# Patient Record
Sex: Male | Born: 1960 | Race: White | Hispanic: No | Marital: Married | Smoking: Never smoker
Health system: Southern US, Community
[De-identification: ages and names within clinical notes are randomized; demographics above are authoritative.]

## PROBLEM LIST (undated history)

## (undated) DIAGNOSIS — E785 Hyperlipidemia, unspecified: Secondary | ICD-10-CM

## (undated) DIAGNOSIS — I10 Essential (primary) hypertension: Secondary | ICD-10-CM

## (undated) DIAGNOSIS — E669 Obesity, unspecified: Secondary | ICD-10-CM

## (undated) DIAGNOSIS — M1990 Unspecified osteoarthritis, unspecified site: Secondary | ICD-10-CM

## (undated) DIAGNOSIS — K219 Gastro-esophageal reflux disease without esophagitis: Secondary | ICD-10-CM

## (undated) HISTORY — PX: TONSILLECTOMY: SUR1361

## (undated) HISTORY — DX: Essential (primary) hypertension: I10

## (undated) HISTORY — DX: Hyperlipidemia, unspecified: E78.5

## (undated) HISTORY — PX: KNEE ARTHROSCOPY: SUR90

## (undated) HISTORY — DX: Obesity, unspecified: E66.9

## (undated) HISTORY — DX: Gastro-esophageal reflux disease without esophagitis: K21.9

---

## 1987-08-17 HISTORY — PX: RHINOPLASTY: SUR1284

## 2004-03-16 ENCOUNTER — Ambulatory Visit (HOSPITAL_COMMUNITY): Admission: RE | Admit: 2004-03-16 | Discharge: 2004-03-16 | Payer: Self-pay | Admitting: *Deleted

## 2004-09-15 ENCOUNTER — Ambulatory Visit: Payer: Self-pay | Admitting: Family Medicine

## 2004-09-22 ENCOUNTER — Ambulatory Visit: Payer: Self-pay | Admitting: Family Medicine

## 2004-10-29 ENCOUNTER — Ambulatory Visit: Payer: Self-pay | Admitting: Family Medicine

## 2005-05-07 ENCOUNTER — Ambulatory Visit: Payer: Self-pay | Admitting: Family Medicine

## 2005-05-19 ENCOUNTER — Ambulatory Visit: Payer: Self-pay | Admitting: Family Medicine

## 2006-02-10 ENCOUNTER — Ambulatory Visit: Payer: Self-pay | Admitting: Internal Medicine

## 2006-02-21 ENCOUNTER — Ambulatory Visit: Payer: Self-pay | Admitting: Family Medicine

## 2006-03-07 ENCOUNTER — Ambulatory Visit: Payer: Self-pay | Admitting: Family Medicine

## 2006-04-20 ENCOUNTER — Ambulatory Visit: Payer: Self-pay | Admitting: Family Medicine

## 2007-04-13 DIAGNOSIS — K219 Gastro-esophageal reflux disease without esophagitis: Secondary | ICD-10-CM | POA: Insufficient documentation

## 2007-04-13 DIAGNOSIS — E669 Obesity, unspecified: Secondary | ICD-10-CM

## 2007-04-13 DIAGNOSIS — E785 Hyperlipidemia, unspecified: Secondary | ICD-10-CM | POA: Insufficient documentation

## 2007-04-13 DIAGNOSIS — I10 Essential (primary) hypertension: Secondary | ICD-10-CM

## 2007-05-05 ENCOUNTER — Ambulatory Visit: Payer: Self-pay | Admitting: Family Medicine

## 2007-05-05 LAB — CONVERTED CEMR LAB
Basophils Relative: 0.8 % (ref 0.0–1.0)
Bilirubin Urine: NEGATIVE
Bilirubin, Direct: 0.2 mg/dL (ref 0.0–0.3)
CO2: 29 meq/L (ref 19–32)
Cholesterol: 185 mg/dL (ref 0–200)
Creatinine, Ser: 1.1 mg/dL (ref 0.4–1.5)
GFR calc Af Amer: 94 mL/min
HCT: 42.2 % (ref 39.0–52.0)
HDL: 24.9 mg/dL — ABNORMAL LOW (ref >39.0)
Hemoglobin: 15 g/dL (ref 13.0–17.0)
Ketones, urine, test strip: NEGATIVE
LDL Cholesterol: 137 mg/dL — ABNORMAL HIGH (ref 0–99)
Lymphocytes Relative: 21.2 % (ref 12.0–46.0)
MCHC: 35.6 g/dL (ref 30.0–36.0)
Monocytes Absolute: 0.5 10*3/uL (ref 0.2–0.7)
Monocytes Relative: 8.5 % (ref 3.0–11.0)
Neutro Abs: 3.9 10*3/uL (ref 1.4–7.7)
Neutrophils Relative %: 65.8 % (ref 43.0–77.0)
Potassium: 5 meq/L (ref 3.5–5.1)
RDW: 11.8 % (ref 11.5–14.6)
Sodium: 144 meq/L (ref 135–145)
Specific Gravity, Urine: 1.025
TSH: 1.58 microintl units/mL (ref 0.35–5.50)
Total Bilirubin: 1.1 mg/dL (ref 0.3–1.2)
Total Protein: 6.5 g/dL (ref 6.0–8.3)
Urobilinogen, UA: 0.2
VLDL: 23 mg/dL (ref 0–40)

## 2007-05-12 ENCOUNTER — Ambulatory Visit: Payer: Self-pay | Admitting: Family Medicine

## 2007-12-11 ENCOUNTER — Ambulatory Visit: Payer: Self-pay | Admitting: Family Medicine

## 2007-12-18 ENCOUNTER — Ambulatory Visit: Payer: Self-pay | Admitting: Family Medicine

## 2008-06-18 ENCOUNTER — Ambulatory Visit: Payer: Self-pay | Admitting: Family Medicine

## 2008-06-18 ENCOUNTER — Telehealth: Payer: Self-pay | Admitting: Family Medicine

## 2008-06-18 DIAGNOSIS — R197 Diarrhea, unspecified: Secondary | ICD-10-CM

## 2008-06-25 ENCOUNTER — Ambulatory Visit: Payer: Self-pay | Admitting: Family Medicine

## 2008-06-25 DIAGNOSIS — M109 Gout, unspecified: Secondary | ICD-10-CM

## 2008-06-26 LAB — CONVERTED CEMR LAB: Uric Acid, Serum: 6.7 mg/dL (ref 4.0–7.8)

## 2008-07-30 ENCOUNTER — Ambulatory Visit: Payer: Self-pay | Admitting: Family Medicine

## 2008-07-30 LAB — CONVERTED CEMR LAB
AST: 19 units/L (ref 0–37)
Alkaline Phosphatase: 65 units/L (ref 39–117)
Bilirubin, Direct: 0.1 mg/dL (ref 0.0–0.3)
CO2: 27 meq/L (ref 19–32)
Chloride: 107 meq/L (ref 96–112)
GFR calc Af Amer: 117 mL/min
Glucose, Bld: 91 mg/dL (ref 70–99)
Lymphocytes Relative: 10 % — ABNORMAL LOW (ref 12.0–46.0)
Monocytes Absolute: 0.9 10*3/uL (ref 0.1–1.0)
Monocytes Relative: 7 % (ref 3.0–12.0)
Neutrophils Relative %: 81.5 % — ABNORMAL HIGH (ref 43.0–77.0)
Platelets: 248 10*3/uL (ref 150–400)
Potassium: 3.7 meq/L (ref 3.5–5.1)
RDW: 12.3 % (ref 11.5–14.6)
Sodium: 142 meq/L (ref 135–145)
Total Protein: 6.5 g/dL (ref 6.0–8.3)
WBC: 13.3 10*3/uL — ABNORMAL HIGH (ref 4.5–10.5)

## 2008-08-01 ENCOUNTER — Telehealth (INDEPENDENT_AMBULATORY_CARE_PROVIDER_SITE_OTHER): Payer: Self-pay | Admitting: *Deleted

## 2008-08-02 ENCOUNTER — Telehealth (INDEPENDENT_AMBULATORY_CARE_PROVIDER_SITE_OTHER): Payer: Self-pay | Admitting: *Deleted

## 2008-08-13 ENCOUNTER — Telehealth: Payer: Self-pay | Admitting: Family Medicine

## 2008-12-04 ENCOUNTER — Ambulatory Visit: Payer: Self-pay | Admitting: Family Medicine

## 2008-12-04 LAB — CONVERTED CEMR LAB
AST: 29 units/L (ref 0–37)
Alkaline Phosphatase: 56 units/L (ref 39–117)
BUN: 14 mg/dL (ref 6–23)
Basophils Absolute: 0 10*3/uL (ref 0.0–0.1)
Bilirubin Urine: NEGATIVE
Blood in Urine, dipstick: NEGATIVE
Calcium: 9.1 mg/dL (ref 8.4–10.5)
Cholesterol: 183 mg/dL (ref 0–200)
GFR calc non Af Amer: 76.78 mL/min (ref >60)
Glucose, Bld: 82 mg/dL (ref 70–99)
Glucose, Urine, Semiquant: NEGATIVE
HDL: 30 mg/dL — ABNORMAL LOW (ref >39.00)
Lymphocytes Relative: 20.4 % (ref 12.0–46.0)
Monocytes Relative: 9.2 % (ref 3.0–12.0)
Neutrophils Relative %: 65.3 % (ref 43.0–77.0)
Platelets: 224 10*3/uL (ref 150.0–400.0)
Protein, U semiquant: NEGATIVE
RDW: 12.3 % (ref 11.5–14.6)
TSH: 1.63 microintl units/mL (ref 0.35–5.50)
Total Bilirubin: 1.1 mg/dL (ref 0.3–1.2)
Urobilinogen, UA: 0.2
VLDL: 19.4 mg/dL (ref 0.0–40.0)
WBC Urine, dipstick: NEGATIVE
pH: 7

## 2008-12-11 ENCOUNTER — Ambulatory Visit: Payer: Self-pay | Admitting: Family Medicine

## 2008-12-23 ENCOUNTER — Encounter: Payer: Self-pay | Admitting: Family Medicine

## 2008-12-23 ENCOUNTER — Ambulatory Visit: Payer: Self-pay | Admitting: Family Medicine

## 2008-12-26 ENCOUNTER — Telehealth: Payer: Self-pay | Admitting: Family Medicine

## 2008-12-26 DIAGNOSIS — I781 Nevus, non-neoplastic: Secondary | ICD-10-CM

## 2009-06-20 ENCOUNTER — Ambulatory Visit: Payer: Self-pay | Admitting: Family Medicine

## 2009-06-20 DIAGNOSIS — N529 Male erectile dysfunction, unspecified: Secondary | ICD-10-CM

## 2009-06-20 DIAGNOSIS — B351 Tinea unguium: Secondary | ICD-10-CM

## 2009-06-21 ENCOUNTER — Telehealth: Payer: Self-pay | Admitting: Family Medicine

## 2009-12-10 ENCOUNTER — Ambulatory Visit: Payer: Self-pay | Admitting: Family Medicine

## 2009-12-10 LAB — CONVERTED CEMR LAB
Alkaline Phosphatase: 66 units/L (ref 39–117)
BUN: 32 mg/dL — ABNORMAL HIGH (ref 6–23)
Basophils Absolute: 0 10*3/uL (ref 0.0–0.1)
Bilirubin Urine: NEGATIVE
Bilirubin, Direct: 0 mg/dL (ref 0.0–0.3)
CO2: 30 meq/L (ref 19–32)
Calcium: 9.4 mg/dL (ref 8.4–10.5)
Cholesterol: 216 mg/dL — ABNORMAL HIGH (ref 0–200)
Creatinine, Ser: 1.1 mg/dL (ref 0.4–1.5)
Direct LDL: 149.1 mg/dL
Eosinophils Absolute: 0.2 10*3/uL (ref 0.0–0.7)
Glucose, Bld: 75 mg/dL (ref 70–99)
Ketones, urine, test strip: NEGATIVE
Lymphocytes Relative: 20.8 % (ref 12.0–46.0)
MCHC: 34.8 g/dL (ref 30.0–36.0)
Neutrophils Relative %: 65.2 % (ref 43.0–77.0)
Platelets: 216 10*3/uL (ref 150.0–400.0)
RBC: 5.25 M/uL (ref 4.22–5.81)
RDW: 14 % (ref 11.5–14.6)
Total Bilirubin: 0.9 mg/dL (ref 0.3–1.2)
Total CHOL/HDL Ratio: 5
Triglycerides: 189 mg/dL — ABNORMAL HIGH (ref 0.0–149.0)
Urobilinogen, UA: 0.2

## 2009-12-15 ENCOUNTER — Telehealth: Payer: Self-pay | Admitting: Family Medicine

## 2009-12-15 ENCOUNTER — Ambulatory Visit: Payer: Self-pay | Admitting: Family Medicine

## 2009-12-17 ENCOUNTER — Ambulatory Visit: Payer: Self-pay | Admitting: Family Medicine

## 2010-01-30 ENCOUNTER — Ambulatory Visit: Payer: Self-pay | Admitting: Family Medicine

## 2010-01-30 LAB — CONVERTED CEMR LAB
Cholesterol, target level: 200 mg/dL
LDL Goal: 130 mg/dL

## 2010-06-19 ENCOUNTER — Telehealth: Payer: Self-pay | Admitting: Family Medicine

## 2010-06-19 ENCOUNTER — Emergency Department (HOSPITAL_COMMUNITY): Admission: EM | Admit: 2010-06-19 | Discharge: 2010-06-19 | Payer: Self-pay | Admitting: Emergency Medicine

## 2010-06-30 ENCOUNTER — Ambulatory Visit: Payer: Self-pay | Admitting: Family Medicine

## 2010-08-06 ENCOUNTER — Telehealth: Payer: Self-pay | Admitting: Family Medicine

## 2010-09-17 NOTE — Assessment & Plan Note (Signed)
Summary: CONSULT RE: HIGH BP/? MED CHANGE/CJR   Vital Signs:  Patient profile:   50 year old male Weight:      288 pounds Temp:     98.5 degrees F oral Pulse rate:   108 / minute Pulse rhythm:   regular BP sitting:   140 / 112  (left arm) Cuff size:   large  Vitals Entered By: Alfred Levins, CMA (June 30, 2010 1:39 PM) CC: hypertension   CC:  hypertension.  History of Present Illness: Richard Martinez is a 50 year old, married male, nonsmoker, who comes in today for evaluation of hypertension.  His blood pressure is running 140/90.  It was elevated two weeks ago, when he was in the emergency room for a laceration ,,,,,,he  is currently taking lisinopril 30 mg daily  Current Medications (verified): 1)  Prilosec 40 Mg  Cpdr (Omeprazole) .... Take 1 Tablet By Mouth Every Morning 2)  Zestril 20 Mg  Tabs (Lisinopril) .Marland Kitchen.. 1 & 1/2 Qam 3)  Allopurinol 300 Mg Tabs (Allopurinol) .... 2 By Mouth Qam 4)  Osteo Bi-Flex Joint Shield  Tabs (Misc Natural Products) .... Once Daily  Allergies (verified): No Known Drug Allergies  Past History:  Past medical, surgical, family and social histories (including risk factors) reviewed for relevance to current acute and chronic problems.  Past Medical History: Reviewed history from 06/25/2008 and no changes required. GERD Hyperlipidemia Hypertension obese Gout  Past Surgical History: Reviewed history from 04/13/2007 and no changes required. CO poisoning  Family History: Reviewed history from 12/11/2007 and no changes required. Family History Diabetes 1st degree relative Family History Hypertension  Social History: Reviewed history from 12/11/2007 and no changes required. Occupation: Married Never Smoked Alcohol use-no Drug use-no Regular exercise-yes  Review of Systems      See HPI  Physical Exam  General:  Well-developed,well-nourished,in no acute distress; alert,appropriate and cooperative throughout examination Heart:  140/90  right arm sitting position   Impression & Recommendations:  Problem # 1:  HYPERTENSION (ICD-401.9) Assessment Deteriorated  His updated medication list for this problem includes:    Zestril 20 Mg Tabs (Lisinopril) .Marland Kitchen... 1 & 1/2 qam    Lisinopril 40 Mg Tabs (Lisinopril) .Marland Kitchen... Take 1 tablet by mouth every morning  Complete Medication List: 1)  Prilosec 40 Mg Cpdr (Omeprazole) .... Take 1 tablet by mouth every morning 2)  Zestril 20 Mg Tabs (Lisinopril) .Marland Kitchen.. 1 & 1/2 qam 3)  Allopurinol 300 Mg Tabs (Allopurinol) .... 2 by mouth qam 4)  Osteo Bi-flex Joint Shield Tabs (Misc natural products) .... Once daily 5)  Lisinopril 40 Mg Tabs (Lisinopril) .... Take 1 tablet by mouth every morning  Patient Instructions: 1)  increase the lisinopril to 40 mg daily.  Check your blood pressure daily in the morning........Marland Kitchen  If after two weeks. y  blood pressure is not normal, then go to 60 mg a day.  Call if you have any problems or need a refill Prescriptions: LISINOPRIL 40 MG TABS (LISINOPRIL) Take 1 tablet by mouth every morning  #100 x 3   Entered and Authorized by:   Roderick Pee MD   Signed by:   Roderick Pee MD on 06/30/2010   Method used:   Print then Give to Patient   RxID:   [id number]    Orders Added: 1)  Est. Patient Level III [13086]

## 2010-09-17 NOTE — Progress Notes (Signed)
Summary: Call A Nurse   Call-A-Nurse Triage Call Report Triage Record Num: [id number] Operator: Edythe Lynn Patient Name: Richard Martinez Call Date & Time: 06/19/2010 6:44:28AM Patient Phone: [phone] PCP: Eugenio Hoes. Dev Patient Gender: Male PCP Fax : [phone] Patient DOB: 1961-08-14 Practice Name: [facility] Reason for Call: Pt says he cut off the fleshy end part of his thumb . Bleeding will not stop. Sent to ER. Protocol(s) Used: Abrasions, Lacerations, Puncture Wounds Recommended Outcome per Protocol: See ED Immediately Reason for Outcome: Bleeding not controlled with 10 minutes of direct pressure or controlled with pressure but starts again when pressure released Care Advice:  ~ 11/

## 2010-09-17 NOTE — Assessment & Plan Note (Signed)
Summary: CPX/CJR   Vital Signs:  Patient profile:   50 year old male Height:      72 inches Weight:      278 pounds BMI:     37.84 Temp:     97.8 degrees F oral BP sitting:   130 / 90  (left arm)  Vitals Entered By: Kern Reap CMA Duncan Dull) (Dec 15, 2009 9:20 AM) CC: cpx Is Patient Diabetic? No Pain Assessment Patient in pain? no        CC:  cpx.  History of Present Illness: Richard Martinez  is a 5 year oldmarried male, nonsmoker, who comes in today for a general physical examination today, because of a history of reflux esophagitis, hypertension, gout, and erectile dysfunction.  As reflux esophagitis history with Prilosec 40 mg daily asymptomatic on medication.  His hypertension.  History with Zestril 20 mg daily BP here 130/90.  Asked to get morning.  BP checks x 3 weeks and send me the data.  He takes 600 mg of allopurinol daily for prophylaxis of gout.  He sees his rheumatologist, Dr. Harriet Masson low on a yearly basis.  He also uses either 50 mg p.r.n. for ED.  routine eye and dental care.  Tetanus 2005 seasonal flu 2010.  he wears contacts for distance vision.  He is a football. ref.  He had his left knee scope 2005 for a loose cartilage.  He also needs it redone.  Dr. Thomasena Edis will do this. this month   Allergies: No Known Drug Allergies  Past History:  Past medical, surgical, family and social histories (including risk factors) reviewed, and no changes noted (except as noted below).  Past Medical History: Reviewed history from 06/25/2008 and no changes required. GERD Hyperlipidemia Hypertension obese Gout  Past Surgical History: Reviewed history from 04/13/2007 and no changes required. CO poisoning  Family History: Reviewed history from 12/11/2007 and no changes required. Family History Diabetes 1st degree relative Family History Hypertension  Social History: Reviewed history from 12/11/2007 and no changes required. Occupation: Married Never Smoked Alcohol  use-no Drug use-no Regular exercise-yes  Review of Systems      See HPI  Physical Exam  General:  Well-developed,well-nourished,in no acute distress; alert,appropriate and cooperative throughout examination Head:  Normocephalic and atraumatic without obvious abnormalities. No apparent alopecia or balding. Eyes:  No corneal or conjunctival inflammation noted. EOMI. Perrla. Funduscopic exam benign, without hemorrhages, exudates or papilledema. Vision grossly normal. Ears:  External ear exam shows no significant lesions or deformities.  Otoscopic examination reveals clear canals, tympanic membranes are intact bilaterally without bulging, retraction, inflammation or discharge. Hearing is grossly normal bilaterally. Nose:  External nasal examination shows no deformity or inflammation. Nasal mucosa are pink and moist without lesions or exudates. Mouth:  Oral mucosa and oropharynx without lesions or exudates.  Teeth in good repair. Neck:  No deformities, masses, or tenderness noted. Chest Wall:  No deformities, masses, tenderness or gynecomastia noted. Breasts:  No masses or gynecomastia noted Lungs:  Normal respiratory effort, chest expands symmetrically. Lungs are clear to auscultation, no crackles or wheezes. Heart:  Normal rate and regular rhythm. S1 and S2 normal without gallop, murmur, click, rub or other extra sounds. Abdomen:  Bowel sounds positive,abdomen soft and non-tender without masses, organomegaly or hernias noted. Rectal:  No external abnormalities noted. Normal sphincter tone. No rectal masses or tenderness. Genitalia:  Testes bilaterally descended without nodularity, tenderness or masses. No scrotal masses or lesions. No penis lesions or urethral discharge. Prostate:  Prostate gland firm  and smooth, no enlargement, nodularity, tenderness, mass, asymmetry or induration. Msk:  No deformity or scoliosis noted of thoracic or lumbar spine.   Pulses:  R and L  carotid,radial,femoral,dorsalis pedis and posterior tibial pulses are full and equal bilaterally Extremities:  No clubbing, cyanosis, edema, or deformity noted with normal full range of motion of all joints.   Neurologic:  No cranial nerve deficits noted. Station and gait are normal. Plantar reflexes are down-going bilaterally. DTRs are symmetrical throughout. Sensory, motor and coordinative functions appear intact. Skin:  Intact without suspicious lesions or rashes Cervical Nodes:  No lymphadenopathy noted Axillary Nodes:  No palpable lymphadenopathy Inguinal Nodes:  No significant adenopathy Psych:  Cognition and judgment appear intact. Alert and cooperative with normal attention span and concentration. No apparent delusions, illusions, hallucinations   Impression & Recommendations:  Problem # 1:  ERECTILE DYSFUNCTION, ORGANIC (ICD-607.84) Assessment Improved  His updated medication list for this problem includes:    Viagra 50 Mg Tabs (Sildenafil citrate) ..... Uad  Problem # 2:  GOUT (ICD-274.9) Assessment: Improved  The following medications were removed from the medication list:    Colchicine 0.6 Mg Tabs (Colchicine) .Marland Kitchen... Take 1 tablet by mouth t once daily His updated medication list for this problem includes:    Allopurinol 300 Mg Tabs (Allopurinol) .Marland Kitchen... 2 by mouth qam  Problem # 3:  HYPERTENSION (ICD-401.9) Assessment: Improved  His updated medication list for this problem includes:    Zestril 20 Mg Tabs (Lisinopril) .Marland Kitchen... Take 1 tablet by mouth every morning  Orders: EKG w/ Interpretation (93000)  Problem # 4:  GERD (ICD-530.81) Assessment: Improved  His updated medication list for this problem includes:    Prilosec 40 Mg Cpdr (Omeprazole) .Marland Kitchen... Take 1 tablet by mouth every morning  Complete Medication List: 1)  Prilosec 40 Mg Cpdr (Omeprazole) .... Take 1 tablet by mouth every morning 2)  Zestril 20 Mg Tabs (Lisinopril) .... Take 1 tablet by mouth every  morning 3)  Allopurinol 300 Mg Tabs (Allopurinol) .... 2 by mouth qam 4)  Osteo Bi-flex Joint Shield Tabs (Misc natural products) .... Once daily 5)  Viagra 50 Mg Tabs (Sildenafil citrate) .... Uad  Other Orders: TB Skin Test [phone]) Admin 1st Vaccine (60454)  Patient Instructions: 1)  Please schedule a follow-up appointment in 1 year. 2)  checked a blood pressure daily in the morning for 3 weeks.  Fax me the data at [phone] Prescriptions: ALLOPURINOL 300 MG TABS (ALLOPURINOL) 2 by mouth qam  #200 x 3   Entered and Authorized by:   Roderick Pee MD   Signed by:   Roderick Pee MD on 12/15/2009   Method used:   Electronically to        [facility] # [phone]* (retail)       [address]       [place], [place]  [address]       Ph: [phone]       Fax: [phone]   RxID:   [id number] VIAGRA 50 MG TABS (SILDENAFIL CITRATE) UAD  #6 x 11   Entered and Authorized by:   Roderick Pee MD   Signed by:   Roderick Pee MD on 12/15/2009   Method used:   Electronically to        [facility] # [id number]* (retail)       [address]       [place], [place]  [address]  Ph: [phone]       Fax: [phone]   RxID:   [id number] ALLOPURINOL 300 MG TABS (ALLOPURINOL) Take 1 tablet by mouth every morning  #200 x 3   Entered and Authorized by:   Roderick Pee MD   Signed by:   Roderick Pee MD on 12/15/2009   Method used:   Electronically to        [facility] # [id number]* (retail)       [address]       [place], [place]  [address]       Ph: [phone]       Fax: [phone]   RxID:   [id number] ZESTRIL 20 MG  TABS (LISINOPRIL) Take 1 tablet by mouth every morning  #100 x 3   Entered and Authorized by:   Roderick Pee MD   Signed by:   Roderick Pee MD on 12/15/2009   Method used:   Electronically to        [facility] # [id number]* (retail)       [address]       [place], [place]  [address]        Ph: [phone]       Fax: [phone]   RxID:   [id number] PRILOSEC 40 MG  CPDR (OMEPRAZOLE) Take 1 tablet by mouth every morning  #100 Capsule x 3   Entered and Authorized by:   Roderick Pee MD   Signed by:   Roderick Pee MD on 12/15/2009   Method used:   Electronically to        [facility] # [id number]* (retail)       [address]       [place], [place]  [address]       Ph: [phone]       Fax: [phone]   RxID:   [id number] VIAGRA 50 MG TABS (SILDENAFIL CITRATE) UAD  #6 x 11   Entered and Authorized by:   Roderick Pee MD   Signed by:   Roderick Pee MD on 12/15/2009   Method used:   Print then Give to Patient   RxID:   [id number] ALLOPURINOL 300 MG TABS (ALLOPURINOL) Take 1 tablet by mouth every morning  #200 x 3   Entered and Authorized by:   Roderick Pee MD   Signed by:   Roderick Pee MD on 12/15/2009   Method used:   Print then Give to Patient   RxID:   [id number] ZESTRIL 20 MG  TABS (LISINOPRIL) Take 1 tablet by mouth every morning  #100 x 3   Entered and Authorized by:   Roderick Pee MD   Signed by:   Roderick Pee MD on 12/15/2009   Method used:   Print then Give to Patient   RxID:   [id number] PRILOSEC 40 MG  CPDR (OMEPRAZOLE) Take 1 tablet by mouth every morning  #100 Capsule x 3   Entered and Authorized by:   Roderick Pee MD   Signed by:   Roderick Pee MD on 12/15/2009   Method used:   Print then Give to Patient   RxID:   [id number]    Immunization History:  Influenza Immunization History:    Influenza:  historical (05/16/2009)  Immunizations Administered:  PPD Skin Test:    Vaccine Type: PPD    Site: left forearm    Mfr: Sanofi Pasteur    Dose:  0.1 ml    Route: ID    Given by: Kern Reap CMA (AAMA)    Exp. Date: 05/29/2011    Lot #: [id number]    Physician counseled: yes

## 2010-09-17 NOTE — Assessment & Plan Note (Signed)
Summary: ppd reading - rv  Nurse Visit   Allergies: No Known Drug Allergies  PPD Results    Date of reading: 12/17/2009    Results: < 5mm    Interpretation: negative

## 2010-09-17 NOTE — Progress Notes (Signed)
Summary: [facility] re: Allopurinol directions. Which one?  Phone Note From Pharmacy Call back at [phone] [facility]   [facility]er: [facility] Summary of Call: [facility] re: Allopurinol. 2 diff directions were called in. One for 2 once daily and One for two times a day. which one do we use?  Initial call taken by: Lucy Antigua,  Dec 15, 2009 10:37 AM  Follow-up for Phone Call        two tabs daily Follow-up by: Roderick Pee MD,  Dec 15, 2009 11:56 AM    Prescriptions: ALLOPURINOL 300 MG TABS (ALLOPURINOL) 2 by mouth qam  #200 x 3   Entered by:   Kern Reap CMA (AAMA)   Authorized by:   Roderick Pee MD   Signed by:   Kern Reap CMA (AAMA) on 12/15/2009   Method used:   Electronically to        [facility] # [address] (retail)       [address]       [place], [place]  [address]       Ph: [phone]       Fax: [phone]   RxID:   [id number]

## 2010-09-17 NOTE — Progress Notes (Signed)
Summary: hydromet refill  Phone Note Refill Request Message from:  Fax from Pharmacy on August 06, 2010 9:15 AM   Follow-up for Phone Call        patient is requesting a refill of hydromet cough syrup [phone] Follow-up by: Kern Reap CMA Duncan Dull),  August 06, 2010 9:16 AM  Additional Follow-up for Phone Call Additional follow up Details #1::        Hydromet 4 ounces directions one half to 1 teaspoon at bedtime p.r.n. cough and cold one refill Additional Follow-up by: Roderick Pee MD,  August 06, 2010 1:38 PM    Additional Follow-up for Phone Call Additional follow up Details #2::    rx called in Follow-up by: Kern Reap CMA Duncan Dull),  August 06, 2010 1:45 PM

## 2010-09-17 NOTE — Assessment & Plan Note (Signed)
Summary: head congestion/can not hear/njr   Vital Signs:  Patient profile:   50 year old male Weight:      284 pounds Temp:     98.4 degrees F oral BP sitting:   160 / 110  (left arm) Cuff size:   regular  Vitals Entered By: Kern Reap CMA Duncan Dull) (January 30, 2010 11:19 AM)  CC: sinus pressure, left ear pain, Lipid Management Is Patient Diabetic? No   CC:  sinus pressure, left ear pain, and Lipid Management.  History of Present Illness: Richard Martinez is a 50 year old male, who comes in today for evaluation of two problems.  He had a cold for the past week and yesterday developed severe left-sided ear pain.  He has underlying hypertension, for which he takes Zestril 20 mg daily BP running 85 to 90 diastolic 135 systolic.  Today's blood pressures up because his been taken pseudoephedrine for his cold.  Lipid Management History:      Positive NCEP/ATP III risk factors include male age 50 years old or older and hypertension.  Negative NCEP/ATP III risk factors include non-tobacco-user status.    Allergies: No Known Drug Allergies  Past History:  Past medical, surgical, family and social histories (including risk factors) reviewed for relevance to current acute and chronic problems.  Past Medical History: Reviewed history from 06/25/2008 and no changes required. GERD Hyperlipidemia Hypertension obese Gout  Past Surgical History: Reviewed history from 04/13/2007 and no changes required. CO poisoning  Family History: Reviewed history from 12/11/2007 and no changes required. Family History Diabetes 1st degree relative Family History Hypertension  Social History: Reviewed history from 12/11/2007 and no changes required. Occupation: Married Never Smoked Alcohol use-no Drug use-no Regular exercise-yes  Review of Systems      See HPI  Physical Exam  General:  Well-developed,well-nourished,in no acute distress; alert,appropriate and cooperative throughout  examination Head:  Normocephalic and atraumatic without obvious abnormalities. No apparent alopecia or balding. Eyes:  No corneal or conjunctival inflammation noted. EOMI. Perrla. Funduscopic exam benign, without hemorrhages, exudates or papilledema. Vision grossly normal. Ears:  right TM normal left ear canal full of wax removed.  Left TM red, swollen Nose:  External nasal examination shows no deformity or inflammation. Nasal mucosa are pink and moist without lesions or exudates. Mouth:  Oral mucosa and oropharynx without lesions or exudates.  Teeth in good repair. Neck:  No deformities, masses, or tenderness noted. Lungs:  Normal respiratory effort, chest expands symmetrically. Lungs are clear to auscultation, no crackles or wheezes.   Problems:  Medical Problems Added: 1)  Dx of Otitis Media, Purulent, Acute  (ICD-382.00) 2)  Dx of Viral Infection-unspec  (ICD-079.99) 3)  Dx of Vomiting  (ICD-787.03) 4)  Dx of Otitis Media, Purulent, Acute  (ICD-382.00)  Impression & Recommendations:  Problem # 1:  VIRAL INFECTION-UNSPEC (ICD-079.99) Assessment New  His updated medication list for this problem includes:    Hydromet 5-1.5 Mg/66ml Syrp (Hydrocodone-homatropine) .Marland Kitchen... 1 or 2 tsps at bedtime as needed  Orders: Prescription Created Electronically [phone])  Problem # 2:  OTITIS MEDIA, PURULENT, ACUTE (ICD-382.00) Assessment: New  His updated medication list for this problem includes:    Amoxicillin 500 Mg Caps (Amoxicillin) .Marland Kitchen... Take 1 tablet by mouth three times a day  Orders: Prescription Created Electronically [phone])  Complete Medication List: 1)  Prilosec 40 Mg Cpdr (Omeprazole) .... Take 1 tablet by mouth every morning 2)  Zestril 20 Mg Tabs (Lisinopril) .Marland Kitchen.. 1 & 1/2 qam 3)  Allopurinol 300 Mg  Tabs (Allopurinol) .... 2 by mouth qam 4)  Osteo Bi-flex Joint Shield Tabs (Misc natural products) .... Once daily 5)  Viagra 50 Mg Tabs (Sildenafil citrate) .... Uad 6)   Amoxicillin 500 Mg Caps (Amoxicillin) .... Take 1 tablet by mouth three times a day 7)  Hydromet 5-1.5 Mg/58ml Syrp (Hydrocodone-homatropine) .Marland Kitchen.. 1 or 2 tsps at bedtime as needed  Lipid Assessment/Plan:      Based on NCEP/ATP III, the patient's risk factor category is "0-1 risk factors".  The patient's lipid goals are as follows: Total cholesterol goal is 200; LDL cholesterol goal is 130; HDL cholesterol goal is 40; Triglyceride goal is 150.     Patient Instructions: 1)  stop all pseudoephedrine type products now and for the future. 2)  Hydromet one to 2 teaspoons nightly p.r.n. for nighttime cough and amoxicillin, 500 mg t.i.d. x 10 days for the ear infection.  Return p.r.n. 3)  increase the lisinopril to 30 mg daily.  Check your BP every morning.  If after 3 to 4 weeks, your blood pressure is not at goal.............increased to 40 mg daily.........  Prescriptions: AMOXICILLIN 500 MG CAPS (AMOXICILLIN) Take 1 tablet by mouth three times a day  #30 x 0   Entered and Authorized by:   Roderick Pee MD   Signed by:   Roderick Pee MD on 01/30/2010   Method used:   Electronically to        [facility] # [id number]* (retail)       [address]       [place], [place]  [address]       Ph: [phone]       Fax: [phone]   RxID:   [id number] ZESTRIL 20 MG  TABS (LISINOPRIL) 1 & 1/2 qam  #150 x 3   Entered and Authorized by:   Roderick Pee MD   Signed by:   Roderick Pee MD on 01/30/2010   Method used:   Electronically to        [facility] # [id number]* (retail)       [address]       [place], [place]  [address]       Ph: [phone]       Fax: [phone]   RxID:   [id number] HYDROMET 5-1.5 MG/5ML SYRP (HYDROCODONE-HOMATROPINE) 1 or 2 tsps at bedtime as needed  #8oz x 1   Entered and Authorized by:   Roderick Pee MD   Signed by:   Roderick Pee MD on 01/30/2010   Method used:   Print then Give to Patient   RxID:    [id number] AMOXICILLIN 500 MG CAPS (AMOXICILLIN) Take 1 tablet by mouth three times a day  #30 x 0   Entered and Authorized by:   Roderick Pee MD   Signed by:   Roderick Pee MD on 01/30/2010   Method used:   Print then Give to Patient   RxID:   [id number]

## 2010-09-28 ENCOUNTER — Other Ambulatory Visit: Payer: Self-pay | Admitting: *Deleted

## 2010-09-28 DIAGNOSIS — I10 Essential (primary) hypertension: Secondary | ICD-10-CM

## 2010-09-28 MED ORDER — LISINOPRIL 40 MG PO TABS: 40.0000 mg | ORAL_TABLET | Freq: Every day | ORAL | Status: DC

## 2010-10-01 ENCOUNTER — Other Ambulatory Visit: Payer: Self-pay | Admitting: *Deleted

## 2010-10-01 DIAGNOSIS — I10 Essential (primary) hypertension: Secondary | ICD-10-CM

## 2010-10-01 MED ORDER — LISINOPRIL 40 MG PO TABS: ORAL_TABLET | ORAL | Status: DC

## 2010-12-09 ENCOUNTER — Other Ambulatory Visit: Payer: Self-pay | Admitting: Dermatology

## 2010-12-09 ENCOUNTER — Other Ambulatory Visit (INDEPENDENT_AMBULATORY_CARE_PROVIDER_SITE_OTHER): Payer: BC Managed Care – PPO | Admitting: Family Medicine

## 2010-12-09 DIAGNOSIS — Z0000 Encounter for general adult medical examination without abnormal findings: Secondary | ICD-10-CM

## 2010-12-09 DIAGNOSIS — Z13220 Encounter for screening for lipoid disorders: Secondary | ICD-10-CM

## 2010-12-09 LAB — POCT URINALYSIS DIPSTICK
Bilirubin, UA: NEGATIVE
Ketones, UA: NEGATIVE
Leukocytes, UA: NEGATIVE
Protein, UA: NEGATIVE
Spec Grav, UA: 1.025
pH, UA: 5

## 2010-12-09 LAB — LIPID PANEL
Cholesterol: 191 mg/dL (ref 0–200)
HDL: 34.1 mg/dL — ABNORMAL LOW (ref >39.00)
VLDL: 43.2 mg/dL — ABNORMAL HIGH (ref 0.0–40.0)

## 2010-12-09 LAB — HEPATIC FUNCTION PANEL
Albumin: 4.3 g/dL (ref 3.5–5.2)
Total Protein: 6.8 g/dL (ref 6.0–8.3)

## 2010-12-09 LAB — BASIC METABOLIC PANEL
CO2: 24 mEq/L (ref 19–32)
Calcium: 9.4 mg/dL (ref 8.4–10.5)
Chloride: 106 mEq/L (ref 96–112)
Glucose, Bld: 88 mg/dL (ref 70–99)
Potassium: 4.4 mEq/L (ref 3.5–5.1)
Sodium: 140 mEq/L (ref 135–145)

## 2010-12-09 LAB — CBC WITH DIFFERENTIAL/PLATELET
Basophils Absolute: 0 10*3/uL (ref 0.0–0.1)
Basophils Relative: 0.6 % (ref 0.0–3.0)
Eosinophils Absolute: 0.3 10*3/uL (ref 0.0–0.7)
HCT: 45.5 % (ref 39.0–52.0)
Hemoglobin: 15.9 g/dL (ref 13.0–17.0)
Lymphs Abs: 1.5 10*3/uL (ref 0.7–4.0)
MCHC: 34.9 g/dL (ref 30.0–36.0)
MCV: 88.1 fl (ref 78.0–100.0)
Neutro Abs: 4.9 10*3/uL (ref 1.4–7.7)
RDW: 14.2 % (ref 11.5–14.6)

## 2010-12-09 LAB — LDL CHOLESTEROL, DIRECT: Direct LDL: 122 mg/dL

## 2010-12-17 ENCOUNTER — Encounter: Payer: Self-pay | Admitting: Family Medicine

## 2010-12-21 ENCOUNTER — Other Ambulatory Visit: Payer: Self-pay | Admitting: Family Medicine

## 2010-12-22 ENCOUNTER — Encounter: Payer: Self-pay | Admitting: Family Medicine

## 2010-12-23 ENCOUNTER — Encounter: Payer: Self-pay | Admitting: Family Medicine

## 2010-12-23 ENCOUNTER — Ambulatory Visit (INDEPENDENT_AMBULATORY_CARE_PROVIDER_SITE_OTHER): Payer: BC Managed Care – PPO | Admitting: Family Medicine

## 2010-12-23 DIAGNOSIS — E785 Hyperlipidemia, unspecified: Secondary | ICD-10-CM

## 2010-12-23 DIAGNOSIS — K219 Gastro-esophageal reflux disease without esophagitis: Secondary | ICD-10-CM

## 2010-12-23 DIAGNOSIS — M109 Gout, unspecified: Secondary | ICD-10-CM

## 2010-12-23 DIAGNOSIS — E669 Obesity, unspecified: Secondary | ICD-10-CM

## 2010-12-23 DIAGNOSIS — I10 Essential (primary) hypertension: Secondary | ICD-10-CM

## 2010-12-23 MED ORDER — OMEPRAZOLE 40 MG PO CPDR: 40.0000 mg | DELAYED_RELEASE_CAPSULE | Freq: Every day | ORAL | Status: DC

## 2010-12-23 MED ORDER — ALLOPURINOL 300 MG PO TABS: ORAL_TABLET | ORAL | Status: DC

## 2010-12-23 MED ORDER — LISINOPRIL 40 MG PO TABS: ORAL_TABLET | ORAL | Status: DC

## 2010-12-23 NOTE — Patient Instructions (Signed)
Check your blood pressure daily in the morning for 3 weeks, then fax me the data at [phone].  Work on a daily walking and weight loss.  Continue current medications.  Follow-up in one year or sooner if any problem

## 2010-12-23 NOTE — Progress Notes (Signed)
  Subjective:    Patient ID: Richard Martinez, male    DOB: [birth year], 50 y.o.   MRN: [id number]  HPI  Richard Martinez Is a 50 year old, married male, nonsmoker, who comes in today for general physical examination because of a history of hypertension, reflux, esophagitis, gout.  He takes allopurinol 600 mg daily no attacks on medication.  Four reflux esophagitis.  He takes Prilosec 40 mg daily.  For hypertension.  He takes lisinopril 60 mg daily BP 140 over hundred to check blood pressures.  Q.a.m. X 3 weeks.  He gets routine eye care, dental care, tetanus, 2005,  Social history is pertinent that he and his wife have recently adopted two foster children.  An 27-year-old daughter and the 75-year-old brother.    Review of Systems  Constitutional: Negative.   HENT: Negative.   Eyes: Negative.   Respiratory: Negative.   Cardiovascular: Negative.   Gastrointestinal: Negative.   Genitourinary: Negative.   Musculoskeletal: Negative.   Skin: Positive for wound.  Neurological: Negative.   Hematological: Negative.   Psychiatric/Behavioral: Negative.        Objective:   Physical Exam  Constitutional: He is oriented to person, place, and time. He appears well-developed and well-nourished.  HENT:  Head: Normocephalic and atraumatic.  Right Ear: External ear normal.  Left Ear: External ear normal.  Nose: Nose normal.  Mouth/Throat: Oropharynx is clear and moist.  Eyes: Conjunctivae and EOM are normal. Pupils are equal, round, and reactive to light.  Neck: Normal range of motion. Neck supple. No JVD present. No tracheal deviation present. No thyromegaly present.  Cardiovascular: Normal rate, regular rhythm, normal heart sounds and intact distal pulses.  Exam reveals no gallop and no friction rub.   No murmur heard. Pulmonary/Chest: Effort normal and breath sounds normal. No stridor. No respiratory distress. He has no wheezes. He has no rales. He exhibits no tenderness.  Abdominal: Soft. Bowel sounds  are normal. He exhibits no distension and no mass. There is no tenderness. There is no rebound and no guarding.  Genitourinary: Rectum normal, prostate normal and penis normal. Guaiac negative stool. No penile tenderness.  Musculoskeletal: Normal range of motion. He exhibits no edema and no tenderness.  Lymphadenopathy:    He has no cervical adenopathy.  Neurological: He is alert and oriented to person, place, and time. He has normal reflexes. No cranial nerve deficit. He exhibits normal muscle tone.  Skin: Skin is warm and dry. No rash noted. No erythema. No pallor.       Total body skin exam normal.  There is a scar on his back from a previous removal of a mole by Dr. Yetta Barre.  The dermatologist.  The path was indeterminate.  He is going back for Mohs surgery by Dr. Irene Limbo  Psychiatric: He has a normal mood and affect. His behavior is normal. Judgment and thought content normal.          Assessment & Plan:  Healthy male.  Gout,,,,,,,, continue allopurinol 600 mg daily.  Hypertension.  Continue lisinopril 60 mg daily BP check.  Q.a.m. X 3 weeks and fax me the data.  Reflux esophagitis.  Continue Prilosec 40 mg daily

## 2011-01-20 ENCOUNTER — Other Ambulatory Visit: Payer: Self-pay | Admitting: Dermatology

## 2011-02-08 ENCOUNTER — Other Ambulatory Visit: Payer: Self-pay | Admitting: *Deleted

## 2011-02-08 MED ORDER — LISINOPRIL 40 MG PO TABS: 40.0000 mg | ORAL_TABLET | Freq: Two times a day (BID) | ORAL | Status: DC

## 2011-06-15 ENCOUNTER — Telehealth: Payer: Self-pay | Admitting: Family Medicine

## 2011-06-15 MED ORDER — FLUOCINONIDE 0.05 % EX OINT: TOPICAL_OINTMENT | Freq: Two times a day (BID) | CUTANEOUS | Status: DC

## 2011-06-15 NOTE — Telephone Encounter (Signed)
Lidex .1% gel.......Marland Kitchen Dispensed 30 g, directions small, apply small amounts twice daily one refill

## 2011-06-15 NOTE — Telephone Encounter (Signed)
rx sent and patient is aware 

## 2011-06-15 NOTE — Telephone Encounter (Signed)
Pt has a reaction to meds his orthopedic gave him. He broke in a rash on his chest. Pt has tried over the counter meds but are not helping pt requesting to have a cortisone cream called in.   [facility] [address]

## 2011-07-02 ENCOUNTER — Telehealth: Payer: Self-pay | Admitting: Family Medicine

## 2011-07-02 NOTE — Telephone Encounter (Signed)
Pt decline to see another MD. Pt would like a cough med call into [facility] [phone]. Pt is aware doc out of office.

## 2011-07-05 NOTE — Telephone Encounter (Signed)
Richard Martinez I called in some Hydromet, 4 ounces, with one refill to his pharmacy

## 2011-07-06 MED ORDER — HYDROCODONE-HOMATROPINE 5-1.5 MG/5ML PO SYRP: 5.0000 mL | ORAL_SOLUTION | Freq: Four times a day (QID) | ORAL | Status: AC | PRN

## 2011-07-06 NOTE — Telephone Encounter (Signed)
rx called in

## 2011-10-07 ENCOUNTER — Other Ambulatory Visit: Payer: Self-pay | Admitting: *Deleted

## 2011-10-07 MED ORDER — HYDROCODONE-HOMATROPINE 5-1.5 MG/5ML PO SYRP: 5.0000 mL | ORAL_SOLUTION | Freq: Three times a day (TID) | ORAL | Status: AC | PRN

## 2011-10-07 NOTE — Telephone Encounter (Signed)
Hydromet called in per Dr Ruairi 

## 2011-12-20 ENCOUNTER — Other Ambulatory Visit: Payer: Self-pay | Admitting: Family Medicine

## 2011-12-20 ENCOUNTER — Telehealth: Payer: Self-pay | Admitting: Family Medicine

## 2011-12-20 DIAGNOSIS — R197 Diarrhea, unspecified: Secondary | ICD-10-CM

## 2011-12-20 MED ORDER — CIPROFLOXACIN HCL 250 MG PO TABS: 250.0000 mg | ORAL_TABLET | Freq: Two times a day (BID) | ORAL | Status: AC

## 2011-12-20 NOTE — Telephone Encounter (Signed)
Fleet Contras I called him in some medication for the traveler's diarrhea also advise using the steroid cream that he has at home for a mild case of sun poisoning. Please have niclole set him up for a physical exam before June 15

## 2011-12-20 NOTE — Telephone Encounter (Addendum)
Pt returned from [place] a week ago having diarrhea. Pt is taking anti-inflamatory pills from Dr Thomasena Edis now has welts on shoulder in back very itchy. [facility] [id number]. Pt is requesting cpx before 01-29-2012. Can I create 30 min slot?

## 2011-12-20 NOTE — Telephone Encounter (Signed)
Pt is sch for cpx on 01-26-2012

## 2011-12-28 ENCOUNTER — Other Ambulatory Visit: Payer: Self-pay | Admitting: Family Medicine

## 2012-01-09 ENCOUNTER — Other Ambulatory Visit: Payer: Self-pay | Admitting: Family Medicine

## 2012-01-14 ENCOUNTER — Other Ambulatory Visit (INDEPENDENT_AMBULATORY_CARE_PROVIDER_SITE_OTHER): Payer: BC Managed Care – PPO

## 2012-01-14 DIAGNOSIS — Z0000 Encounter for general adult medical examination without abnormal findings: Secondary | ICD-10-CM

## 2012-01-14 LAB — BASIC METABOLIC PANEL
BUN: 28 mg/dL — ABNORMAL HIGH (ref 6–23)
Calcium: 9.6 mg/dL (ref 8.4–10.5)
GFR: 72.7 mL/min (ref >60.00)
Glucose, Bld: 81 mg/dL (ref 70–99)

## 2012-01-14 LAB — CBC WITH DIFFERENTIAL/PLATELET
Basophils Absolute: 0 10*3/uL (ref 0.0–0.1)
HCT: 45.6 % (ref 39.0–52.0)
Lymphocytes Relative: 23.8 % (ref 12.0–46.0)
Lymphs Abs: 2 10*3/uL (ref 0.7–4.0)
Monocytes Relative: 8.3 % (ref 3.0–12.0)
Platelets: 239 10*3/uL (ref 150.0–400.0)
RDW: 13.8 % (ref 11.5–14.6)

## 2012-01-14 LAB — LIPID PANEL
Cholesterol: 198 mg/dL (ref 0–200)
VLDL: 46.2 mg/dL — ABNORMAL HIGH (ref 0.0–40.0)

## 2012-01-14 LAB — POCT URINALYSIS DIPSTICK
Blood, UA: NEGATIVE
Glucose, UA: NEGATIVE
Ketones, UA: NEGATIVE
Spec Grav, UA: 1.02
Urobilinogen, UA: 0.2

## 2012-01-14 LAB — HEPATIC FUNCTION PANEL
AST: 43 U/L — ABNORMAL HIGH (ref 0–37)
Total Bilirubin: 0.9 mg/dL (ref 0.3–1.2)

## 2012-01-14 LAB — LDL CHOLESTEROL, DIRECT: Direct LDL: 125.7 mg/dL

## 2012-01-14 LAB — TSH: TSH: 1.37 u[IU]/mL (ref 0.35–5.50)

## 2012-01-17 ENCOUNTER — Ambulatory Visit (INDEPENDENT_AMBULATORY_CARE_PROVIDER_SITE_OTHER): Payer: BC Managed Care – PPO | Admitting: Family Medicine

## 2012-01-17 ENCOUNTER — Encounter: Payer: Self-pay | Admitting: Family Medicine

## 2012-01-17 VITALS — BP 120/80 | Temp 98.0°F | Ht 72.75 in | Wt 296.0 lb

## 2012-01-17 DIAGNOSIS — K219 Gastro-esophageal reflux disease without esophagitis: Secondary | ICD-10-CM

## 2012-01-17 DIAGNOSIS — I10 Essential (primary) hypertension: Secondary | ICD-10-CM

## 2012-01-17 DIAGNOSIS — E785 Hyperlipidemia, unspecified: Secondary | ICD-10-CM

## 2012-01-17 DIAGNOSIS — L259 Unspecified contact dermatitis, unspecified cause: Secondary | ICD-10-CM

## 2012-01-17 DIAGNOSIS — E669 Obesity, unspecified: Secondary | ICD-10-CM

## 2012-01-17 DIAGNOSIS — M109 Gout, unspecified: Secondary | ICD-10-CM

## 2012-01-17 MED ORDER — FLUOCINONIDE 0.05 % EX OINT: TOPICAL_OINTMENT | Freq: Two times a day (BID) | CUTANEOUS | Status: AC

## 2012-01-17 MED ORDER — ALLOPURINOL 300 MG PO TABS: 300.0000 mg | ORAL_TABLET | Freq: Every day | ORAL | Status: DC

## 2012-01-17 MED ORDER — LISINOPRIL 40 MG PO TABS: 40.0000 mg | ORAL_TABLET | Freq: Two times a day (BID) | ORAL | Status: DC

## 2012-01-17 MED ORDER — OMEPRAZOLE 40 MG PO CPDR: 40.0000 mg | DELAYED_RELEASE_CAPSULE | Freq: Every day | ORAL | Status: DC

## 2012-01-17 NOTE — Patient Instructions (Signed)
I would take the allopurinol daily  Continue the lisinopril 80 mg daily and aspirin  I would recommend Dr. Para March orthopedist for knee replacement  Return in one year sooner if any problems

## 2012-01-17 NOTE — Progress Notes (Signed)
  Subjective:    Patient ID: Richard Martinez, male    DOB: 1961/04/13, 51 y.o.   MRN: [id number]  HPI Richard Martinez is a 51 year old married male nonsmoker who comes in today for general physical examination  He has a history of gout and has been taking allopurinol 300 mg daily until 6 months ago and stopped it asymptomatic  He has a history of degenerative joint disease both knees. He seen his orthopedist Dr. Thomasena Edis and is on an anti-inflammatory one daily  He takes Prilosec 40 mg daily for reflux  He takes lisinopril 80 mg daily for hypertension BP 120/80  He saw his dermatologist Dr. Yetta Barre this winter and had a chemical peel of his face    Review of Systems  Constitutional: Negative.   HENT: Negative.   Eyes: Negative.   Respiratory: Negative.   Cardiovascular: Negative.   Gastrointestinal: Negative.   Genitourinary: Negative.   Musculoskeletal: Negative.   Skin: Negative.   Neurological: Negative.   Hematological: Negative.   Psychiatric/Behavioral: Negative.        Objective:   Physical Exam  Constitutional: He is oriented to person, place, and time. He appears well-developed and well-nourished.  HENT:  Head: Normocephalic and atraumatic.  Right Ear: External ear normal.  Left Ear: External ear normal.  Nose: Nose normal.  Mouth/Throat: Oropharynx is clear and moist.  Eyes: Conjunctivae and EOM are normal. Pupils are equal, round, and reactive to light.  Neck: Normal range of motion. Neck supple. No JVD present. No tracheal deviation present. No thyromegaly present.  Cardiovascular: Normal rate, regular rhythm, normal heart sounds and intact distal pulses.  Exam reveals no gallop and no friction rub.   No murmur heard. Pulmonary/Chest: Effort normal and breath sounds normal. No stridor. No respiratory distress. He has no wheezes. He has no rales. He exhibits no tenderness.  Abdominal: Soft. Bowel sounds are normal. He exhibits no distension and no mass. There is no tenderness.  There is no rebound and no guarding.  Genitourinary: Penis normal.  Musculoskeletal: Normal range of motion. He exhibits no edema and no tenderness.  Lymphadenopathy:    He has no cervical adenopathy.  Neurological: He is alert and oriented to person, place, and time. He has normal reflexes. No cranial nerve deficit. He exhibits normal muscle tone.  Skin: Skin is warm and dry. No rash noted. No erythema. No pallor.  Psychiatric: He has a normal mood and affect. His behavior is normal. Judgment and thought content normal.          Assessment & Plan:  Healthy male  Hypertension continue lisinopril 80 mg daily  History of gout take allopurinol daily  Degenerative joint disease followup by orthopedics  Reflux esophagitis continue Prilosec 40 mg daily

## 2012-01-19 ENCOUNTER — Other Ambulatory Visit: Payer: BC Managed Care – PPO

## 2012-01-26 ENCOUNTER — Ambulatory Visit: Payer: BC Managed Care – PPO | Admitting: Family Medicine

## 2012-08-15 ENCOUNTER — Other Ambulatory Visit: Payer: Self-pay | Admitting: Family Medicine

## 2012-10-12 ENCOUNTER — Other Ambulatory Visit: Payer: Self-pay | Admitting: Family Medicine

## 2013-01-23 ENCOUNTER — Encounter: Payer: Self-pay | Admitting: Family Medicine

## 2013-01-23 ENCOUNTER — Ambulatory Visit (INDEPENDENT_AMBULATORY_CARE_PROVIDER_SITE_OTHER): Payer: BC Managed Care – PPO | Admitting: Family Medicine

## 2013-01-23 VITALS — BP 120/84 | Temp 98.6°F | Wt 288.0 lb

## 2013-01-23 DIAGNOSIS — R197 Diarrhea, unspecified: Secondary | ICD-10-CM | POA: Insufficient documentation

## 2013-01-23 DIAGNOSIS — H6123 Impacted cerumen, bilateral: Secondary | ICD-10-CM

## 2013-01-23 DIAGNOSIS — H6120 Impacted cerumen, unspecified ear: Secondary | ICD-10-CM

## 2013-01-23 NOTE — Progress Notes (Signed)
  Subjective:    Patient ID: Richard Martinez, male    DOB: Oct 03, 1960, 52 y.o.   MRN: [id number]  HPI Richard Martinez is a 52 year old male married nonsmoker who comes in today for evaluation of 2 problems  He has very small ear canals and gets recurrent cerumen impactions  He went to [place] about a month ago and developed some diarrhea which has not gone away. No fever chills or vomiting   Review of Systems    review of systems otherwise negative Objective:   Physical Exam  Well-developed well-nourished male no acute distress examination of the ears shows small canals with cerumen removed with irrigation and a curet        Assessment & Plan:  Cerumen impactions,,,,,,,, removed with irrigation Clydie Braun  Persistent diarrhea 1 month after being in [place] plan stool culture

## 2013-01-23 NOTE — Patient Instructions (Signed)
Stool culture  A call you and I get the report

## 2013-01-28 LAB — STOOL CULTURE

## 2013-02-06 ENCOUNTER — Other Ambulatory Visit: Payer: Self-pay | Admitting: Family Medicine

## 2013-02-26 ENCOUNTER — Other Ambulatory Visit (INDEPENDENT_AMBULATORY_CARE_PROVIDER_SITE_OTHER): Payer: BC Managed Care – PPO

## 2013-02-26 ENCOUNTER — Telehealth: Payer: Self-pay | Admitting: Family Medicine

## 2013-02-26 DIAGNOSIS — Z0000 Encounter for general adult medical examination without abnormal findings: Secondary | ICD-10-CM

## 2013-02-26 DIAGNOSIS — E785 Hyperlipidemia, unspecified: Secondary | ICD-10-CM

## 2013-02-26 LAB — HEPATIC FUNCTION PANEL
ALT: 61 U/L — ABNORMAL HIGH (ref 0–53)
AST: 43 U/L — ABNORMAL HIGH (ref 0–37)
Alkaline Phosphatase: 66 U/L (ref 39–117)
Bilirubin, Direct: 0 mg/dL (ref 0.0–0.3)
Total Protein: 7.3 g/dL (ref 6.0–8.3)

## 2013-02-26 LAB — CBC WITH DIFFERENTIAL/PLATELET
Basophils Absolute: 0 10*3/uL (ref 0.0–0.1)
Eosinophils Absolute: 0.3 10*3/uL (ref 0.0–0.7)
HCT: 46.6 % (ref 39.0–52.0)
Hemoglobin: 16 g/dL (ref 13.0–17.0)
Lymphocytes Relative: 16.2 % (ref 12.0–46.0)
Lymphs Abs: 1.2 10*3/uL (ref 0.7–4.0)
MCHC: 34.3 g/dL (ref 30.0–36.0)
Neutro Abs: 5.2 10*3/uL (ref 1.4–7.7)
Platelets: 222 10*3/uL (ref 150.0–400.0)
RDW: 13.5 % (ref 11.5–14.6)

## 2013-02-26 LAB — BASIC METABOLIC PANEL
Calcium: 9.7 mg/dL (ref 8.4–10.5)
Creatinine, Ser: 1.2 mg/dL (ref 0.4–1.5)
Sodium: 140 mEq/L (ref 135–145)

## 2013-02-26 LAB — POCT URINALYSIS DIPSTICK
Blood, UA: NEGATIVE
Spec Grav, UA: >=1.03
pH, UA: 5.5

## 2013-02-26 LAB — LIPID PANEL
Cholesterol: 214 mg/dL — ABNORMAL HIGH (ref 0–200)
HDL: 33.8 mg/dL — ABNORMAL LOW (ref >39.00)
Triglycerides: 373 mg/dL — ABNORMAL HIGH (ref 0.0–149.0)

## 2013-02-26 NOTE — Telephone Encounter (Signed)
okay

## 2013-02-26 NOTE — Telephone Encounter (Signed)
PT's 7/21 CPX is being bumped, the next available appt isn't until into September. He is requesting that his CPX be completed prior to then. Please assist.

## 2013-03-05 ENCOUNTER — Encounter: Payer: BC Managed Care – PPO | Admitting: Family Medicine

## 2013-03-16 ENCOUNTER — Other Ambulatory Visit: Payer: Self-pay | Admitting: Family Medicine

## 2013-03-22 ENCOUNTER — Encounter: Payer: BC Managed Care – PPO | Admitting: Family Medicine

## 2013-04-10 ENCOUNTER — Encounter: Payer: Self-pay | Admitting: Internal Medicine

## 2013-04-10 ENCOUNTER — Ambulatory Visit (INDEPENDENT_AMBULATORY_CARE_PROVIDER_SITE_OTHER): Payer: BC Managed Care – PPO | Admitting: Internal Medicine

## 2013-04-10 VITALS — BP 130/90 | HR 77 | Temp 98.2°F | Resp 20 | Ht 72.0 in | Wt 288.0 lb

## 2013-04-10 DIAGNOSIS — I10 Essential (primary) hypertension: Secondary | ICD-10-CM

## 2013-04-10 DIAGNOSIS — Z0000 Encounter for general adult medical examination without abnormal findings: Secondary | ICD-10-CM

## 2013-04-10 DIAGNOSIS — E669 Obesity, unspecified: Secondary | ICD-10-CM

## 2013-04-10 DIAGNOSIS — E785 Hyperlipidemia, unspecified: Secondary | ICD-10-CM

## 2013-04-10 MED ORDER — LISINOPRIL 40 MG PO TABS: ORAL_TABLET | ORAL | Status: DC

## 2013-04-10 MED ORDER — ALLOPURINOL 300 MG PO TABS: ORAL_TABLET | ORAL | Status: DC

## 2013-04-10 MED ORDER — OMEPRAZOLE 40 MG PO CPDR: DELAYED_RELEASE_CAPSULE | ORAL | Status: DC

## 2013-04-10 MED ORDER — ETODOLAC 400 MG PO TABS: 400.0000 mg | ORAL_TABLET | Freq: Every day | ORAL | Status: DC

## 2013-04-10 NOTE — Progress Notes (Signed)
Subjective:    Patient ID: Richard Martinez, male    DOB: 02/13/1961, 52 y.o.   MRN: [id number]  HPI  52 year old patient who is seen today for a health maintenance examination. Past medical history is pertinent for hypertension gout exogenous obesity and gastroesophageal reflux disease. He has some mild dyslipidemia. He also has a history of elevated liver function studies consistent with fatty liver He is adhering to a diet and exercising regularly. There has been some modest weight loss over the past several months.  Past medical history the patient has had arthroscopic knee surgery twice on the left and once on the right  Social history married 2 adopted children ages 31 and 56. 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 GERD (gastroesophageal reflux disease)   . Hyperlipidemia   . Hypertension   . Gout   . Obesity     History   Social History  . Marital Status: Married    Spouse Name: N/A    Number of Children: N/A  . Years of Education: N/A   Occupational History  . Not on file.   Social History Main Topics  . Smoking status: Never Smoker   . Smokeless tobacco: Not on file  . Alcohol Use: No  . Drug Use: No  . Sexual Activity:    Other Topics Concern  . Not on file   Social History Narrative  . No narrative on file    History reviewed. No pertinent past surgical history.  Family History  Problem Relation Age of Onset  . Diabetes      fhx  . Hypertension      fhx    No Known Allergies  No current outpatient prescriptions on file prior to visit.   No current facility-administered medications on file prior to visit.    BP 130/90  Pulse 77  Temp(Src) 98.2 F (36.8 C) (Oral)  Resp 20  Ht 6' (1.829 m)   Wt 288 lb (130.636 kg)  BMI 39.05 kg/m2  SpO2 98%      Review of Systems  Constitutional: Negative for fever, chills, activity change, appetite change and fatigue.  HENT: Negative for hearing loss, ear pain, congestion, rhinorrhea, sneezing, mouth sores, trouble swallowing, neck pain, neck stiffness, dental problem, voice change, sinus pressure and tinnitus.   Eyes: Negative for photophobia, pain, redness and visual disturbance.  Respiratory: Negative for apnea, cough, choking, chest tightness, shortness of breath and wheezing.   Cardiovascular: Negative for chest pain, palpitations and leg swelling.  Gastrointestinal: Negative for nausea, vomiting, abdominal pain, diarrhea, constipation, blood in stool, abdominal distention, anal bleeding and rectal pain.  Genitourinary: Negative for dysuria, urgency, frequency, hematuria, flank pain, decreased urine volume, discharge, penile swelling, scrotal swelling, difficulty urinating, genital sores and testicular pain.  Musculoskeletal: Negative for myalgias, back pain, joint swelling, arthralgias (knee pain) and gait problem.  Skin: Negative for color change, rash and wound.  Neurological: Negative for dizziness, tremors, seizures, syncope, facial asymmetry, speech difficulty, weakness, light-headedness, numbness and headaches.  Hematological: Negative for adenopathy. Does not bruise/bleed easily.  Psychiatric/Behavioral: Negative for suicidal ideas, hallucinations, behavioral problems, confusion, sleep disturbance, self-injury, dysphoric mood, decreased concentration and agitation. The patient is not nervous/anxious.        Objective:  Physical Exam  Constitutional: He appears well-developed and well-nourished.   Weight 288  Repeat blood pressure 140/80 to  HENT:  Head: Normocephalic and atraumatic.  Right Ear: External ear normal.  Left Ear: External ear normal.  Nose: Nose normal.  Mouth/Throat: Oropharynx is clear and moist.  Eyes:  Conjunctivae and EOM are normal. Pupils are equal, round, and reactive to light. No scleral icterus.  Neck: Normal range of motion. Neck supple. No JVD present. No thyromegaly present.  Cardiovascular: Regular rhythm, normal heart sounds and intact distal pulses.  Exam reveals no gallop and no friction rub.   No murmur heard. Pulmonary/Chest: Effort normal and breath sounds normal. He exhibits no tenderness.  Abdominal: Soft. Bowel sounds are normal. He exhibits no distension and no mass. There is no tenderness.  Genitourinary: Prostate normal and penis normal. Guaiac negative stool.  Musculoskeletal: Normal range of motion. He exhibits no edema and no tenderness.  Hypertrophic changes involving the right knee  Lymphadenopathy:    He has no cervical adenopathy.  Neurological: He is alert. He has normal reflexes. No cranial nerve deficit. Coordination normal.  Skin: Skin is warm and dry. No rash noted.  Psychiatric: He has a normal mood and affect. His behavior is normal.          Assessment & Plan:   Preventive health examination Hypertension Exogenous obesity Dyslipidemia Fatty liver History of gout  We'll continue  efforts at exercise and weight loss Medicines updated Colonoscopy next year

## 2013-04-10 NOTE — Patient Instructions (Addendum)
Limit your sodium (Salt) intake    It is important that you exercise regularly, at least 20 minutes 3 to 4 times per week.  If you develop chest pain or shortness of breath seek  medical attention.  You need to lose weight.  Consider a lower calorie diet and regular exercise.  Please check your blood pressure on a regular basis.  If it is consistently greater than 150/90, please make an office appointment.  Return in one year for follow-up  DASH Diet The DASH diet stands for "Dietary Approaches to Stop Hypertension." It is a healthy eating plan that has been shown to reduce high blood pressure (hypertension) in as little as 14 days, while also possibly providing other significant health benefits. These other health benefits include reducing the risk of breast cancer after menopause and reducing the risk of type 2 diabetes, heart disease, colon cancer, and stroke. Health benefits also include weight loss and slowing kidney failure in patients with chronic kidney disease.  DIET GUIDELINES  Limit salt (sodium). Your diet should contain less than 1500 mg of sodium daily.  Limit refined or processed carbohydrates. Your diet should include mostly whole grains. Desserts and added sugars should be used sparingly.  Include small amounts of heart-healthy fats. These types of fats include nuts, oils, and tub margarine. Limit saturated and trans fats. These fats have been shown to be harmful in the body. CHOOSING FOODS  The following food groups are based on a 2000 calorie diet. See your Registered Dietitian for individual calorie needs. Grains and Grain Products (6 to 8 servings daily)  Eat More Often: Whole-wheat bread, brown rice, whole-grain or wheat pasta, quinoa, popcorn without added fat or salt (air popped).  Eat Less Often: White bread, white pasta, white rice, cornbread. Vegetables (4 to 5 servings daily)  Eat More Often: Fresh, frozen, and canned vegetables. Vegetables may be raw, steamed,  roasted, or grilled with a minimal amount of fat.  Eat Less Often/Avoid: Creamed or fried vegetables. Vegetables in a cheese sauce. Fruit (4 to 5 servings daily)  Eat More Often: All fresh, canned (in natural juice), or frozen fruits. Dried fruits without added sugar. One hundred percent fruit juice ( cup [237 mL] daily).  Eat Less Often: Dried fruits with added sugar. Canned fruit in light or heavy syrup. Lean Meats, Fish, and Poultry (2 servings or less daily. One serving is 3 to 4 oz [85-114 g]).  Eat More Often: Ninety percent or leaner ground beef, tenderloin, sirloin. Round cuts of beef, chicken breast, turkey breast. All fish. Grill, bake, or broil your meat. Nothing should be fried.  Eat Less Often/Avoid: Fatty cuts of meat, turkey, or chicken leg, thigh, or wing. Fried cuts of meat or fish. Dairy (2 to 3 servings)  Eat More Often: Low-fat or fat-free milk, low-fat plain or light yogurt, reduced-fat or part-skim cheese.  Eat Less Often/Avoid: Milk (whole, 2%).Whole milk yogurt. Full-fat cheeses. Nuts, Seeds, and Legumes (4 to 5 servings per week)  Eat More Often: All without added salt.  Eat Less Often/Avoid: Salted nuts and seeds, canned beans with added salt. Fats and Sweets (limited)  Eat More Often: Vegetable oils, tub margarines without trans fats, sugar-free gelatin. Mayonnaise and salad dressings.  Eat Less Often/Avoid: Coconut oils, palm oils, butter, stick margarine, cream, half and half, cookies, candy, pie. FOR MORE INFORMATION The Dash Diet Eating Plan: www.dashdiet.org Document Released: 07/22/2011 Document Revised: 10/25/2011 Document Reviewed: 07/22/2011 [employer] Patient Information 2014 [employer].  

## 2013-08-30 ENCOUNTER — Telehealth: Payer: Self-pay | Admitting: Family Medicine

## 2013-08-30 MED ORDER — HYDROCODONE-HOMATROPINE 5-1.5 MG/5ML PO SYRP: ORAL_SOLUTION | ORAL | Status: DC

## 2013-08-30 NOTE — Telephone Encounter (Signed)
rx ready for pick up. Left message on machine for patient.

## 2013-08-30 NOTE — Telephone Encounter (Signed)
Pt has cough and would like to cough syrup. Pt [facility] [address]

## 2013-10-03 ENCOUNTER — Telehealth: Payer: Self-pay | Admitting: Family Medicine

## 2013-10-03 NOTE — Telephone Encounter (Signed)
Pt would like another refill on the HYDROcodone-homatropine (HYCODAN) 5-1.5 MG/5ML syrup

## 2013-10-04 MED ORDER — HYDROCODONE-HOMATROPINE 5-1.5 MG/5ML PO SYRP: ORAL_SOLUTION | ORAL | Status: DC

## 2013-10-04 NOTE — Telephone Encounter (Signed)
rx ready for pick up and patient is aware  

## 2013-10-15 ENCOUNTER — Ambulatory Visit (INDEPENDENT_AMBULATORY_CARE_PROVIDER_SITE_OTHER): Payer: BC Managed Care – PPO | Admitting: Family Medicine

## 2013-10-15 ENCOUNTER — Encounter: Payer: Self-pay | Admitting: Family Medicine

## 2013-10-15 VITALS — BP 140/90 | Temp 99.4°F | Wt 306.0 lb

## 2013-10-15 DIAGNOSIS — J45909 Unspecified asthma, uncomplicated: Secondary | ICD-10-CM

## 2013-10-15 MED ORDER — PREDNISONE 20 MG PO TABS: ORAL_TABLET | ORAL | Status: DC

## 2013-10-15 NOTE — Progress Notes (Signed)
Pre visit review using our clinic review tool, if applicable. No additional management support is needed unless otherwise documented below in the visit note. 

## 2013-10-15 NOTE — Patient Instructions (Signed)
Prednisone 20 mg.......... 2 tabs x3 days or until you feel a lot better then begin to taper as outlined  In the future I would try to take a steroid nasal spray along with an antihistamine nightly starting probably around September

## 2013-10-15 NOTE — Progress Notes (Signed)
   Subjective:    Patient ID: [name], male    DOB: February 26, 1961, 53 y.o.   MRN: [id number]  HPI Blessing is a 53 year old male nonsmoker who comes in today for evaluation of head congestion postnasal drip and coughing for 3 months  He's he starts in the fall with a congestion postnasal dripping manage it with over-the-counter antihistamines. This time it doesn't seem to help. Couple weeks ago he began coughing and wheezing   Review of Systems    review of systems negative environmental review of systems negative Objective:   Physical Exam  Well-developed well-nourished male no acute distress vital signs stable he is afebrile ENT negative except for 3+ nasal edema postnasal drip neck was supple no adenopathy lungs are clear except for some very faint mild symmetrical bilateral wheezing on forced expiration      Assessment & Plan:  Allergic rhinitis with mild asthma plan prednisone burst and taper

## 2013-10-18 ENCOUNTER — Telehealth: Payer: Self-pay | Admitting: Family Medicine

## 2013-10-18 NOTE — Telephone Encounter (Signed)
Pt was seen on Monday was given  rx predniSONE (DELTASONE) 20 MG tablet, pt states none of the meds are working and dr. Sherren Mocha stated for him to call back if he didn't get any better. Pt requesting a call back from the nurse, states his coughing is still uncontrollable.

## 2013-10-18 NOTE — Telephone Encounter (Signed)
Pt is aware waiting on md °

## 2013-10-18 NOTE — Telephone Encounter (Signed)
Richard Martinez called [name]........ his wheezing is not improved days tapering his prednisone. I advised him to increase the dose to 60 mg a day for 3 days then taper

## 2013-10-19 MED ORDER — HYDROCODONE-HOMATROPINE 5-1.5 MG/5ML PO SYRP: ORAL_SOLUTION | ORAL | Status: DC

## 2013-10-19 NOTE — Telephone Encounter (Addendum)
Pt states he thought he had enough cough med to last through the weekend, but he does not. Would like a refill of the HYDROcodone-homatropine (HYCODAN) 5-1.5 MG/5ML syrup today.

## 2013-10-19 NOTE — Telephone Encounter (Signed)
Rx ready for pick up and patient is aware 

## 2013-10-27 ENCOUNTER — Other Ambulatory Visit: Payer: Self-pay | Admitting: Internal Medicine

## 2013-10-29 ENCOUNTER — Ambulatory Visit (INDEPENDENT_AMBULATORY_CARE_PROVIDER_SITE_OTHER): Payer: BC Managed Care – PPO | Admitting: Family Medicine

## 2013-10-29 ENCOUNTER — Encounter: Payer: Self-pay | Admitting: Family Medicine

## 2013-10-29 ENCOUNTER — Other Ambulatory Visit: Payer: Self-pay | Admitting: Internal Medicine

## 2013-10-29 ENCOUNTER — Ambulatory Visit (INDEPENDENT_AMBULATORY_CARE_PROVIDER_SITE_OTHER)
Admission: RE | Admit: 2013-10-29 | Discharge: 2013-10-29 | Disposition: A | Discharge status: Home / Self Care | Payer: BC Managed Care – PPO | Source: Ambulatory Visit | Attending: Family Medicine | Admitting: Family Medicine

## 2013-10-29 VITALS — BP 120/90 | Temp 99.0°F | Wt 302.0 lb

## 2013-10-29 DIAGNOSIS — J181 Lobar pneumonia, unspecified organism: Secondary | ICD-10-CM

## 2013-10-29 DIAGNOSIS — J189 Pneumonia, unspecified organism: Secondary | ICD-10-CM

## 2013-10-29 DIAGNOSIS — J45909 Unspecified asthma, uncomplicated: Secondary | ICD-10-CM

## 2013-10-29 MED ORDER — CLARITHROMYCIN 500 MG PO TABS: 500.0000 mg | ORAL_TABLET | Freq: Two times a day (BID) | ORAL | Status: DC

## 2013-10-29 NOTE — Progress Notes (Signed)
Pre visit review using our clinic review tool, if applicable. No additional management support is needed unless otherwise documented below in the visit note. 

## 2013-10-29 NOTE — Patient Instructions (Addendum)
Drink lots of water  Biaxin,,,,,,,,,,,, one twice daily till bilateral empty

## 2013-10-29 NOTE — Progress Notes (Signed)
   Subjective:    Patient ID: Richard Martinez, male    DOB: 30-Dec-1960, 53 y.o.   MRN: [id number]  HPI Richard Martinez is a 53 year old male married nonsmoker who comes in today for followup of cough  We saw him a couple weeks ago and started him on prednisone 40 mg daily after 3 days he wasn't improved we therefore increased the dose to 60 mg daily he then taper off his medication he still coughing. He says he is bringing of some discolored sputum but no fever. Again no foreign travel etc.   Review of Systems Review of systems otherwise negative    Objective:   Physical Exam  Well-developed well-nourished male no acute distress vital signs stable he is afebrile HEENT negative neck was supple no adenopathy lungs rectal clear except for some mild wheezing expiratory forced expiration right lower lobe.  Chest x-ray pending........ chest x-ray shows blurring of the right heart border consistent with a right lower lobe pneumonia      Assessment & Plan:  Mycoplasma pneumonia and......... back 7 500 twice a day for 10 days

## 2013-10-31 ENCOUNTER — Telehealth: Payer: Self-pay | Admitting: Family Medicine

## 2013-10-31 MED ORDER — HYDROCODONE-HOMATROPINE 5-1.5 MG/5ML PO SYRP: ORAL_SOLUTION | ORAL | Status: DC

## 2013-10-31 NOTE — Telephone Encounter (Signed)
Pt would also like a refill of HYDROcodone-homatropine (HYCODAN) 5-1.5 MG/5ML syrup Pt states he will be out today

## 2013-10-31 NOTE — Telephone Encounter (Signed)
Rx ready for pick up and Left message on machine for patient   

## 2013-10-31 NOTE — Telephone Encounter (Signed)
error 

## 2013-12-10 ENCOUNTER — Other Ambulatory Visit: Payer: Self-pay | Admitting: Internal Medicine

## 2014-01-20 ENCOUNTER — Other Ambulatory Visit: Payer: Self-pay | Admitting: Internal Medicine

## 2014-02-04 ENCOUNTER — Encounter: Payer: Self-pay | Admitting: Family Medicine

## 2014-02-04 ENCOUNTER — Ambulatory Visit (INDEPENDENT_AMBULATORY_CARE_PROVIDER_SITE_OTHER): Payer: BC Managed Care – PPO | Admitting: Family Medicine

## 2014-02-04 VITALS — BP 140/100 | Temp 98.3°F | Wt 314.0 lb

## 2014-02-04 DIAGNOSIS — E669 Obesity, unspecified: Secondary | ICD-10-CM

## 2014-02-04 DIAGNOSIS — I10 Essential (primary) hypertension: Secondary | ICD-10-CM

## 2014-02-04 MED ORDER — AMLODIPINE BESYLATE 2.5 MG PO TABS: 2.5000 mg | ORAL_TABLET | Freq: Every day | ORAL | Status: DC

## 2014-02-04 NOTE — Progress Notes (Signed)
   Subjective:    Patient ID: Richard Martinez, male    DOB: 09/21/1960, 54 y.o.   MRN: [id number]  HPI Richard Martinez is a 53 year old married male nonsmoker who comes in today for evaluation of hypertension  His blood pressure on 80 mg of lisinopril daily was normal however this year he gained about 35 pounds BP now 140/100 and afternoon and the morning is in the 494-496 range systolic diastolic 90. He's been compliant with his medication   Review of Systems Review of systems otherwise negative except he can't tolerate diuretics. 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

## 2014-02-04 NOTE — Patient Instructions (Signed)
Continue lisinopril 80 mg daily in the morning  Add Norvasc 2.5 mg one daily in the morning  BP checked daily x4 weeks  No salt diet  Blood pressure goal 135/85 or less............... if not at goal after months call and we will increase her medication by phone

## 2014-02-04 NOTE — Progress Notes (Signed)
Pre visit review using our clinic review tool, if applicable. No additional management support is needed unless otherwise documented below in the visit note. 

## 2014-02-05 ENCOUNTER — Telehealth: Payer: Self-pay | Admitting: Family Medicine

## 2014-02-05 NOTE — Telephone Encounter (Signed)
Relevant patient education mailed to patient.  

## 2014-02-06 ENCOUNTER — Other Ambulatory Visit: Payer: Self-pay | Admitting: Internal Medicine

## 2014-03-15 ENCOUNTER — Telehealth: Payer: Self-pay | Admitting: Family Medicine

## 2014-03-15 DIAGNOSIS — I10 Essential (primary) hypertension: Secondary | ICD-10-CM

## 2014-03-15 NOTE — Telephone Encounter (Signed)
Pt states Dr Sherren Mocha changed his bp medication approximately 6 weeks ago and advised him to call our office to report how he has been doing.  Pt states bp has been ranging from 127/92 to 135/97,  Requesting call back from [place] to discuss if any additional changes need to be made.

## 2014-03-18 ENCOUNTER — Other Ambulatory Visit: Payer: Self-pay | Admitting: Family Medicine

## 2014-03-18 DIAGNOSIS — I10 Essential (primary) hypertension: Secondary | ICD-10-CM

## 2014-03-18 NOTE — Telephone Encounter (Signed)
Per  Dr Sherren Mocha patient should have a renal ultrasound.  Left message on machine for patient and order placed.

## 2014-03-26 ENCOUNTER — Telehealth: Payer: Self-pay | Admitting: Family Medicine

## 2014-03-26 MED ORDER — AMLODIPINE BESYLATE 5 MG PO TABS: 5.0000 mg | ORAL_TABLET | Freq: Every day | ORAL | Status: DC

## 2014-03-26 NOTE — Telephone Encounter (Signed)
Pt was taking amlodipine 2.5 mg and was told to increase to 5 mg. Pt needs new rx sent to [facility] [address] with refills. Pt is out

## 2014-03-26 NOTE — Telephone Encounter (Signed)
Rx sent to pharmacy   

## 2014-03-28 ENCOUNTER — Encounter (INDEPENDENT_AMBULATORY_CARE_PROVIDER_SITE_OTHER): Payer: Self-pay

## 2014-03-28 ENCOUNTER — Ambulatory Visit
Admission: RE | Admit: 2014-03-28 | Discharge: 2014-03-28 | Disposition: A | Discharge status: Home / Self Care | Payer: BC Managed Care – PPO | Source: Ambulatory Visit | Attending: Family Medicine | Admitting: Family Medicine

## 2014-03-28 DIAGNOSIS — I10 Essential (primary) hypertension: Secondary | ICD-10-CM

## 2014-03-30 ENCOUNTER — Other Ambulatory Visit: Payer: Self-pay | Admitting: Internal Medicine

## 2014-04-08 ENCOUNTER — Ambulatory Visit: Payer: BC Managed Care – PPO | Admitting: Family Medicine

## 2014-04-12 ENCOUNTER — Ambulatory Visit (INDEPENDENT_AMBULATORY_CARE_PROVIDER_SITE_OTHER): Payer: BC Managed Care – PPO | Admitting: Family Medicine

## 2014-04-12 ENCOUNTER — Encounter: Payer: Self-pay | Admitting: Family Medicine

## 2014-04-12 VITALS — BP 118/84 | Temp 98.0°F | Wt 307.0 lb

## 2014-04-12 DIAGNOSIS — I10 Essential (primary) hypertension: Secondary | ICD-10-CM

## 2014-04-12 MED ORDER — HYDROCHLOROTHIAZIDE 12.5 MG PO TABS: 12.5000 mg | ORAL_TABLET | Freq: Every day | ORAL | Status: DC

## 2014-04-12 NOTE — Progress Notes (Signed)
   Subjective:    Patient ID: [name], male    DOB: [birth year], 53 y.o.   MRN: [id number]  HPI Kirt is a 53 year old married male nonsmoker who comes in today for followup of hypertension  His blood pressure was not at goal. It seemed to be fluctuating wildly. We did a renal scan to be sure he did not have renal artery stenosis which he did not. He's gotten on a diet and lost 8 pounds so far. 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 He's on Norvasc 5 mg daily lisinopril 80 mg daily. He also noticed some fluid retention swelling of his hands.  BPs at home are averaging 120/80 male   Review of Systems    review of systems negative Objective:   Physical Exam  Well-developed well-nourished male no acute distress vital signs stable he is afebrile BP right I'm sitting position 120/80      Assessment & Plan:  Hypertension at goal............. continue current therapy ........Marland Kitchen

## 2014-04-12 NOTE — Progress Notes (Signed)
Pre visit review using our clinic review tool, if applicable. No additional management support is needed unless otherwise documented below in the visit note. 

## 2014-04-12 NOTE — Patient Instructions (Signed)
Hydrochlorothiazide 12.5 mg......... one tablet daily in the morning  Norvasc 5 mg..........Marland Kitchen 1 daily in the morning  Lisinopril 40 mg......... 2 tabs daily in the morning  Check a blood pressure daily in the morning  If by adding the diuretic your blood pressure drops too low then decrease the lisinopril to 60 mg daily  Continue diet exercise and weight loss program  Followup in one month..........Marland Kitchen bring all your data and the device

## 2014-05-09 ENCOUNTER — Encounter: Payer: Self-pay | Admitting: Family Medicine

## 2014-05-09 ENCOUNTER — Ambulatory Visit (INDEPENDENT_AMBULATORY_CARE_PROVIDER_SITE_OTHER): Payer: BC Managed Care – PPO | Admitting: Family Medicine

## 2014-05-09 VITALS — BP 110/80 | Temp 97.5°F | Wt 300.0 lb

## 2014-05-09 DIAGNOSIS — I10 Essential (primary) hypertension: Secondary | ICD-10-CM

## 2014-05-09 DIAGNOSIS — K625 Hemorrhage of anus and rectum: Secondary | ICD-10-CM

## 2014-05-09 DIAGNOSIS — Z23 Encounter for immunization: Secondary | ICD-10-CM

## 2014-05-09 MED ORDER — STARCH 51 % RE SUPP: RECTAL | Status: DC

## 2014-05-09 NOTE — Progress Notes (Signed)
Pre visit review using our clinic review tool, if applicable. No additional management support is needed unless otherwise documented below in the visit note. 

## 2014-05-09 NOTE — Progress Notes (Signed)
   Subjective:    Patient ID: Richard Martinez, male    DOB: 1961/01/25, 53 y.o.   MRN: [id number]  HPI Richard Martinez is a 53 year old male married nonsmoker who comes in today for followup of hypertension and a new problem of bright red rectal bleeding  We added Norvasc 2.5 mg to his lisinopril 80 mg and hydrochlorothiazide 12.5 mg. The 2.5 mg dose did not get his blood pressure goal. We increased it to 5 and his blood pressure dropped to 90. He decrease his lisinopril from 80 mg a day to 40 and stop the diuretic and continues to Norvasc 5 mg.  BP today 110/80  He states he was up in [place] in a meeting get up in the morning took a shower and went to dry off and had bright red rectal bleeding. Painless  GI review of systems otherwise negative   Review of Systems    review of systems otherwise negative Objective:   Physical Exam Well-developed well nourished male no acute distress vital signs stable he is afebrile BP right arm sitting position 110/80 pulse 70 and regular trace edema at lower extremities  Weight down 12 pounds to 300  Rectal exam extra digit rectum is normal in appearance. Digital rectal exam normal no palpable masses stool guaiac positive       Assessment & Plan:  Hypertension at goal continue above therapy  Episode of bright red rectal bleeding painless....Marland KitchenMarland Kitchen probable internal hemorrhoid....... treat symptomatically.............. colonoscopy this fall.

## 2014-05-09 NOTE — Patient Instructions (Signed)
Stool softener daily  Suppositories.........Marland Kitchen 1 rectally at bedtime for 2 weeks  Norvasc 2.5 mg.......Marland Kitchen 1 daily  Lisinopril 40 mg..... one daily  Hydrochlorothiazide 12.5 mg...........Marland Kitchen 1 when necessary  BP check every morning,,,,,,,,, fax me the data in 4 weeks

## 2014-05-10 ENCOUNTER — Encounter: Payer: Self-pay | Admitting: Gastroenterology

## 2014-05-27 ENCOUNTER — Other Ambulatory Visit: Payer: Self-pay | Admitting: Family Medicine

## 2014-06-05 ENCOUNTER — Ambulatory Visit (AMBULATORY_SURGERY_CENTER): Payer: Self-pay | Admitting: *Deleted

## 2014-06-05 VITALS — Ht 73.0 in | Wt 296.0 lb

## 2014-06-05 DIAGNOSIS — K625 Hemorrhage of anus and rectum: Secondary | ICD-10-CM

## 2014-06-05 MED ORDER — MOVIPREP 100 G PO SOLR: 1.0000 | Freq: Once | ORAL | Status: DC

## 2014-06-05 NOTE — Progress Notes (Signed)
No egg or soy allergy. No anesthesia problems.  No home O2.  No diet meds.  

## 2014-06-11 ENCOUNTER — Encounter: Payer: Self-pay | Admitting: Gastroenterology

## 2014-06-19 ENCOUNTER — Ambulatory Visit (AMBULATORY_SURGERY_CENTER): Payer: BC Managed Care – PPO | Admitting: Gastroenterology

## 2014-06-19 ENCOUNTER — Encounter: Payer: Self-pay | Admitting: Gastroenterology

## 2014-06-19 VITALS — BP 151/102 | HR 69 | Temp 97.4°F | Resp 10 | Ht 72.0 in | Wt 296.0 lb

## 2014-06-19 DIAGNOSIS — K625 Hemorrhage of anus and rectum: Secondary | ICD-10-CM

## 2014-06-19 DIAGNOSIS — D125 Benign neoplasm of sigmoid colon: Secondary | ICD-10-CM

## 2014-06-19 DIAGNOSIS — R195 Other fecal abnormalities: Secondary | ICD-10-CM

## 2014-06-19 MED ORDER — SODIUM CHLORIDE 0.9 % IV SOLN: 500.0000 mL | INTRAVENOUS | Status: DC

## 2014-06-19 NOTE — Progress Notes (Signed)
Called to room to assist during endoscopic procedure.  Patient ID and intended procedure confirmed with present staff. Received instructions for my participation in the procedure from the performing physician.  

## 2014-06-19 NOTE — Patient Instructions (Signed)
Colon polyps x 2 removed today, diverticulosis and hemorrhoids seen. Handouts given:polyps,diverticulosis and hemorrhoids. Await pathology results. OTC Preparation H supp daily as needed. Resume current medications. Call us with any questions or concerns. Thank you!  YOU HAD AN ENDOSCOPIC PROCEDURE TODAY AT [facility]: Refer to the procedure report that was given to you for any specific questions about what was found during the examination.  If the procedure report does not answer your questions, please call your gastroenterologist to clarify.  If you requested that your care partner not be given the details of your procedure findings, then the procedure report has been included in a sealed envelope for you to review at your convenience later.  YOU SHOULD EXPECT: Some feelings of bloating in the abdomen. Passage of more gas than usual.  Walking can help get rid of the air that was put into your GI tract during the procedure and reduce the bloating. If you had a lower endoscopy (such as a colonoscopy or flexible sigmoidoscopy) you may notice spotting of blood in your stool or on the toilet paper. If you underwent a bowel prep for your procedure, then you may not have a normal bowel movement for a few days.  DIET: Your first meal following the procedure should be a light meal and then it is ok to progress to your normal diet.  A half-sandwich or bowl of soup is an example of a good first meal.  Heavy or fried foods are harder to digest and may make you feel nauseous or bloated.  Likewise meals heavy in dairy and vegetables can cause extra gas to form and this can also increase the bloating.  Drink plenty of fluids but you should avoid alcoholic beverages for 24 hours.  ACTIVITY: Your care partner should take you home directly after the procedure.  You should plan to take it easy, moving slowly for the rest of the day.  You can resume normal activity the day after the procedure however you  should NOT DRIVE or use heavy machinery for 24 hours (because of the sedation medicines used during the test).    SYMPTOMS TO REPORT IMMEDIATELY: A gastroenterologist can be reached at any hour.  During normal business hours, 8:30 AM to 5:00 PM Monday through Friday, call [phone].  After hours and on weekends, please call the GI answering service at [phone] who will take a message and have the physician on call contact you.   Following lower endoscopy (colonoscopy or flexible sigmoidoscopy):  Excessive amounts of blood in the stool  Significant tenderness or worsening of abdominal pains  Swelling of the abdomen that is new, acute  Fever of 100F or higher  Following upper endoscopy (EGD)  Vomiting of blood or coffee ground material  New chest pain or pain under the shoulder blades  Painful or persistently difficult swallowing  New shortness of breath  Fever of 100F or higher  Black, tarry-looking stools  FOLLOW UP: If any biopsies were taken you will be contacted by phone or by letter within the next 1-3 weeks.  Call your gastroenterologist if you have not heard about the biopsies in 3 weeks.  Our staff will call the home number listed on your records the next business day following your procedure to check on you and address any questions or concerns that you may have at that time regarding the information given to you following your procedure. This is a courtesy call and so if there is no answer  at the home number and we have not heard from you through the emergency physician on call, we will assume that you have returned to your regular daily activities without incident.  SIGNATURES/CONFIDENTIALITY: You and/or your care partner have signed paperwork which will be entered into your electronic medical record.  These signatures attest to the fact that that the information above on your After Visit Summary has been reviewed and is understood.  Full responsibility of the  confidentiality of this discharge information lies with you and/or your care-partner.

## 2014-06-19 NOTE — Progress Notes (Signed)
Stable to RR 

## 2014-06-19 NOTE — Op Note (Signed)
[facility]  [address] [place], [address]   COLONOSCOPY PROCEDURE REPORT  PATIENT: Richard Martinez, Richard Martinez  MR#: [id number] BIRTHDATE: [birth year] , 52  yrs. old GENDER: male ENDOSCOPIST: Ladene Artist, MD, [facility] REFERRED AO:[id number] Delora Fuel, M.D. PROCEDURE DATE:  06/19/2014 PROCEDURE:   Colonoscopy with snare polypectomy First Screening Colonoscopy - Avg.  risk and is 50 yrs.  old or older - No.  Prior Negative Screening - Now for repeat screening. N/A  History of Adenoma - Now for follow-up colonoscopy & has been > or = to 3 yrs.  N/A  Polyps Removed Today? Yes. ASA CLASS:   Class II INDICATIONS:hematochezia and heme-positive stool. MEDICATIONS: Monitored anesthesia care, Propofol 350 mg IV, and lidocaine 40 mg IV DESCRIPTION OF PROCEDURE:   After the risks benefits and alternatives of the procedure were thoroughly explained, informed consent was obtained.  The digital rectal exam revealed no abnormalities of the rectum.   The LB [id number]  endoscope was introduced through the anus and advanced to the cecum, which was identified by both the appendix and ileocecal valve. No adverse events experienced.   The quality of the prep was excellent, using MoviPrep  The instrument was then slowly withdrawn as the colon was fully examined.  COLON FINDINGS: Two sessile polyps measuring 5 mm in size were found in the sigmoid colon.  A polypectomy was performed with a cold snare.  The resection was complete, the polyp tissue was completely retrieved and sent to histology.   There was mild diverticulosis noted in the sigmoid colon.   The examination was otherwise normal. Retroflexed views revealed internal Grade II hemorrhoids. The time to cecum=1 minutes 20 seconds.  Withdrawal time=13 minutes 10 seconds.  The scope was withdrawn and the procedure completed. COMPLICATIONS: There were no immediate complications.  ENDOSCOPIC IMPRESSION: 1.   Two sessile polyps in the  sigmoid colon; polypectomy performed with a cold snare 2.   Mild diverticulosis in the sigmoid colon 3.   Grade II internal hemorrhoids  RECOMMENDATIONS: 1.  Await pathology results 2.  Repeat colonoscopy in 5 years if polyp(s) adenomatous; otherwise 10 years 3.  OTC Preparation H supp daily as needed  eSigned:  Ladene Artist, MD, [facility] 06/19/2014 2:05 PM

## 2014-06-20 ENCOUNTER — Telehealth: Payer: Self-pay

## 2014-06-20 NOTE — Telephone Encounter (Signed)
Left a message at #[phone] for the pt to call us back if any questions or concerns. maw

## 2014-06-26 ENCOUNTER — Encounter: Payer: Self-pay | Admitting: Gastroenterology

## 2014-07-13 ENCOUNTER — Other Ambulatory Visit: Payer: Self-pay | Admitting: Family Medicine

## 2014-07-19 ENCOUNTER — Other Ambulatory Visit: Payer: Self-pay | Admitting: Internal Medicine

## 2014-10-10 ENCOUNTER — Other Ambulatory Visit: Payer: Self-pay | Admitting: Family Medicine

## 2014-11-06 ENCOUNTER — Other Ambulatory Visit: Payer: Self-pay | Admitting: Internal Medicine

## 2014-12-29 ENCOUNTER — Other Ambulatory Visit: Payer: Self-pay | Admitting: Family Medicine

## 2015-01-03 ENCOUNTER — Other Ambulatory Visit: Payer: Self-pay | Admitting: Family Medicine

## 2015-01-08 ENCOUNTER — Other Ambulatory Visit: Payer: Self-pay | Admitting: *Deleted

## 2015-01-08 MED ORDER — OMEPRAZOLE 40 MG PO CPDR: 40.0000 mg | DELAYED_RELEASE_CAPSULE | Freq: Every day | ORAL | Status: DC

## 2015-01-14 ENCOUNTER — Other Ambulatory Visit: Payer: Self-pay | Admitting: Family Medicine

## 2015-01-30 ENCOUNTER — Encounter: Payer: Self-pay | Admitting: Adult Health

## 2015-01-30 ENCOUNTER — Ambulatory Visit (INDEPENDENT_AMBULATORY_CARE_PROVIDER_SITE_OTHER): Payer: BLUE CROSS/BLUE SHIELD | Admitting: Adult Health

## 2015-01-30 VITALS — BP 110/70 | HR 68 | Temp 98.4°F | Wt 304.0 lb

## 2015-01-30 DIAGNOSIS — H6122 Impacted cerumen, left ear: Secondary | ICD-10-CM | POA: Diagnosis not present

## 2015-01-30 NOTE — Patient Instructions (Addendum)
  Continue to use the drops. If you notice any pain, drainage or you have a fever - please let me know.    Cerumen Impaction A cerumen impaction is when the wax in your ear forms a plug. This plug usually causes reduced hearing. Sometimes it also causes an earache or dizziness. Removing a cerumen impaction can be difficult and painful. The wax sticks to the ear canal. The canal is sensitive and bleeds easily. If you try to remove a heavy wax buildup with a cotton tipped swab, you may push it in further. Irrigation with water, suction, and small ear curettes may be used to clear out the wax. If the impaction is fixed to the skin in the ear canal, ear drops may be needed for a few days to loosen the wax. People who build up a lot of wax frequently can use ear wax removal products available in your local drugstore. SEEK MEDICAL CARE IF:  You develop an earache, increased hearing loss, or marked dizziness. Document Released: 09/09/2004 Document Revised: 10/25/2011 Document Reviewed: 10/30/2009 [facility] Patient Information 2015 [place], [place]. This information is not intended to replace advice given to you by your health care provider. Make sure you discuss any questions you have with your health care provider.

## 2015-01-30 NOTE — Progress Notes (Signed)
   Subjective:    Patient ID: Richard Martinez, male    DOB: [birth year], 54 y.o.   MRN: [id number]  HPI  Patient presents to the office for the feeling of ear fullness in his left ear. He has a history of cerumen impaction. Has been using his ear drops without much success. Denies any drainage or pain.   Review of Systems  Constitutional: Negative.   HENT: Positive for hearing loss. Negative for ear discharge, ear pain, facial swelling, tinnitus and voice change.   Eyes: Negative.   All other systems reviewed and are negative.      Objective:   Physical Exam  Constitutional: He is oriented to person, place, and time. He appears well-developed and well-nourished. No distress.  HENT:  Head: Normocephalic and atraumatic.  Right Ear: External ear normal.  Left Ear: External ear normal.  Nose: Nose normal.  Mouth/Throat: Oropharynx is clear and moist.  Mild cerumen impaction in left ear  Eyes: Conjunctivae are normal. Right eye exhibits no discharge. Left eye exhibits no discharge.  Neurological: He is alert and oriented to person, place, and time.  Skin: He is not diaphoretic.  Psychiatric: He has a normal mood and affect. His behavior is normal. Judgment and thought content normal.  Vitals reviewed.     Assessment & Plan:  1. Impacted cerumen, left [H61.22] - Remove impacted ear wax - Feels better after ear flushed - Follow up if any fever, ear pain or discharge.

## 2015-01-30 NOTE — Progress Notes (Signed)
Pre visit review using our clinic review tool, if applicable. No additional management support is needed unless otherwise documented below in the visit note. 

## 2015-02-11 IMAGING — CR DG CHEST 2V
2 series · 2 of 2 positions shown · non-contrast
Comparison: None.

CLINICAL DATA: Three-week history of cough and asthma. Crackles in
the right lung base. Congestion and fever.

EXAM:
CHEST  2 VIEW

[view not recorded (1 of 2)]
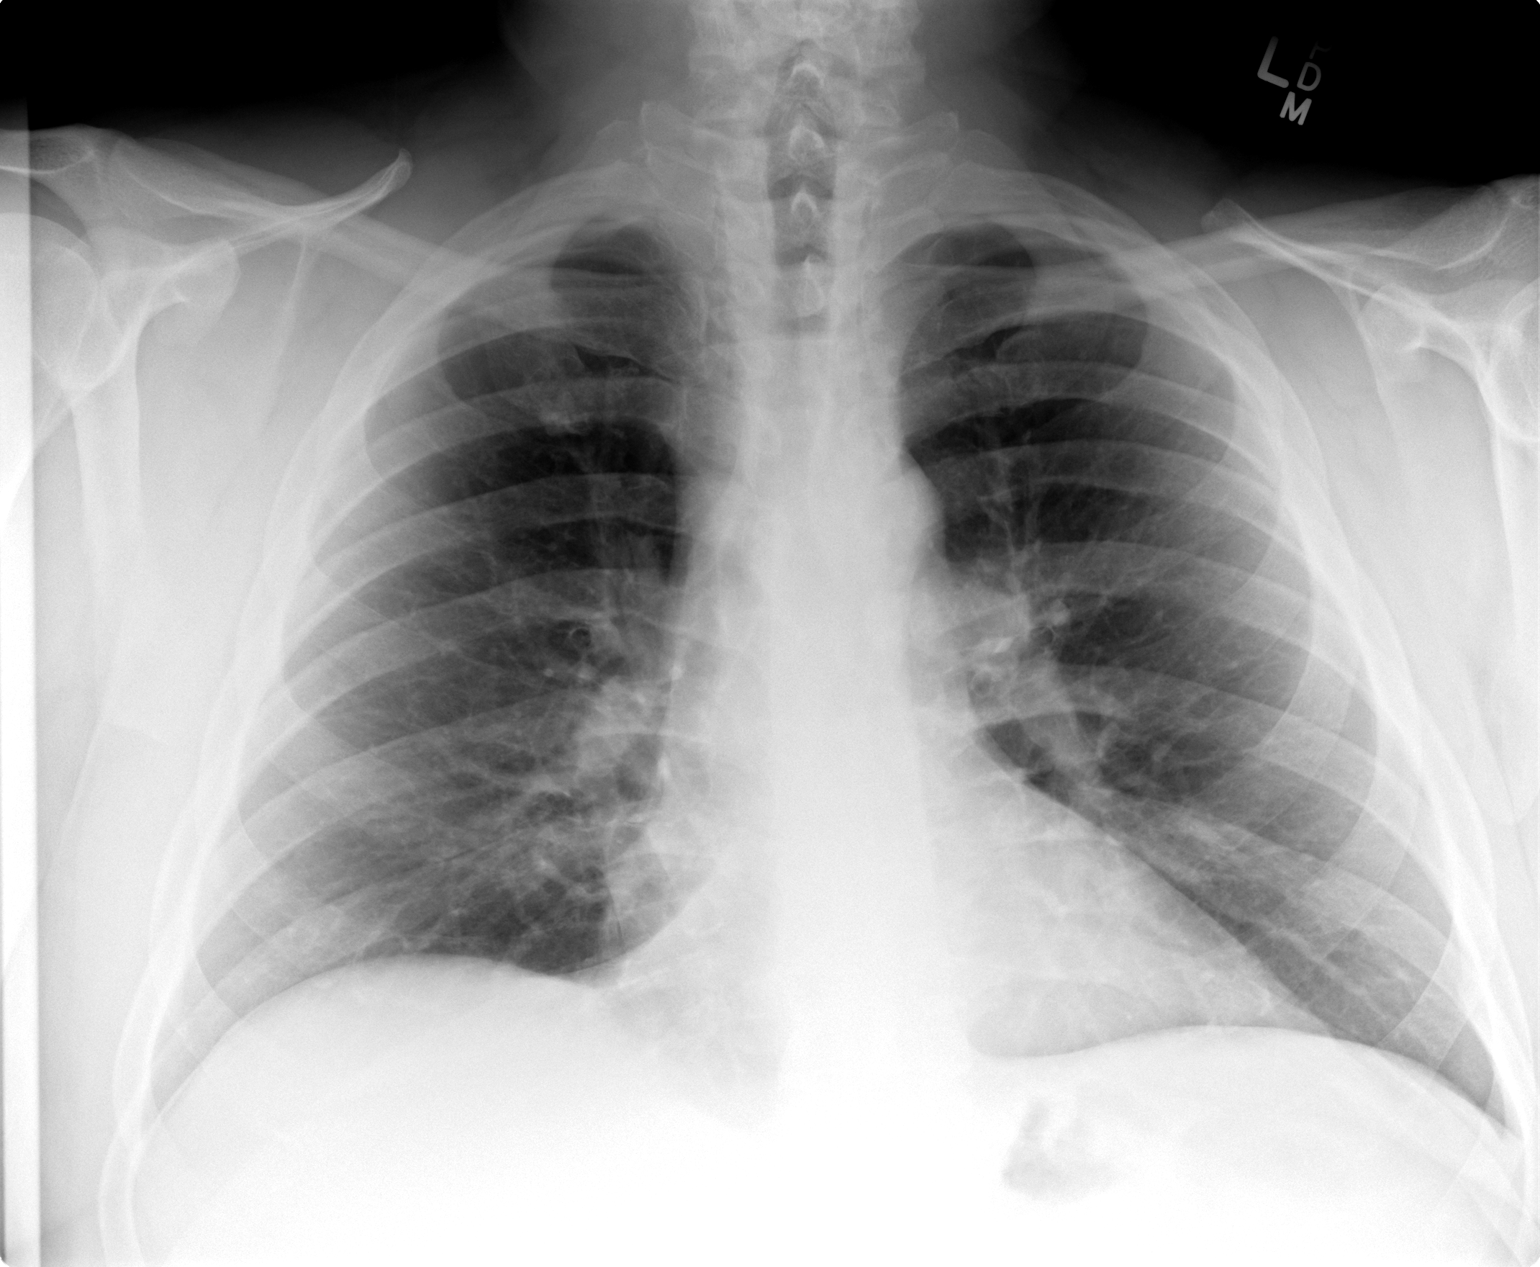

[view not recorded (2 of 2)]
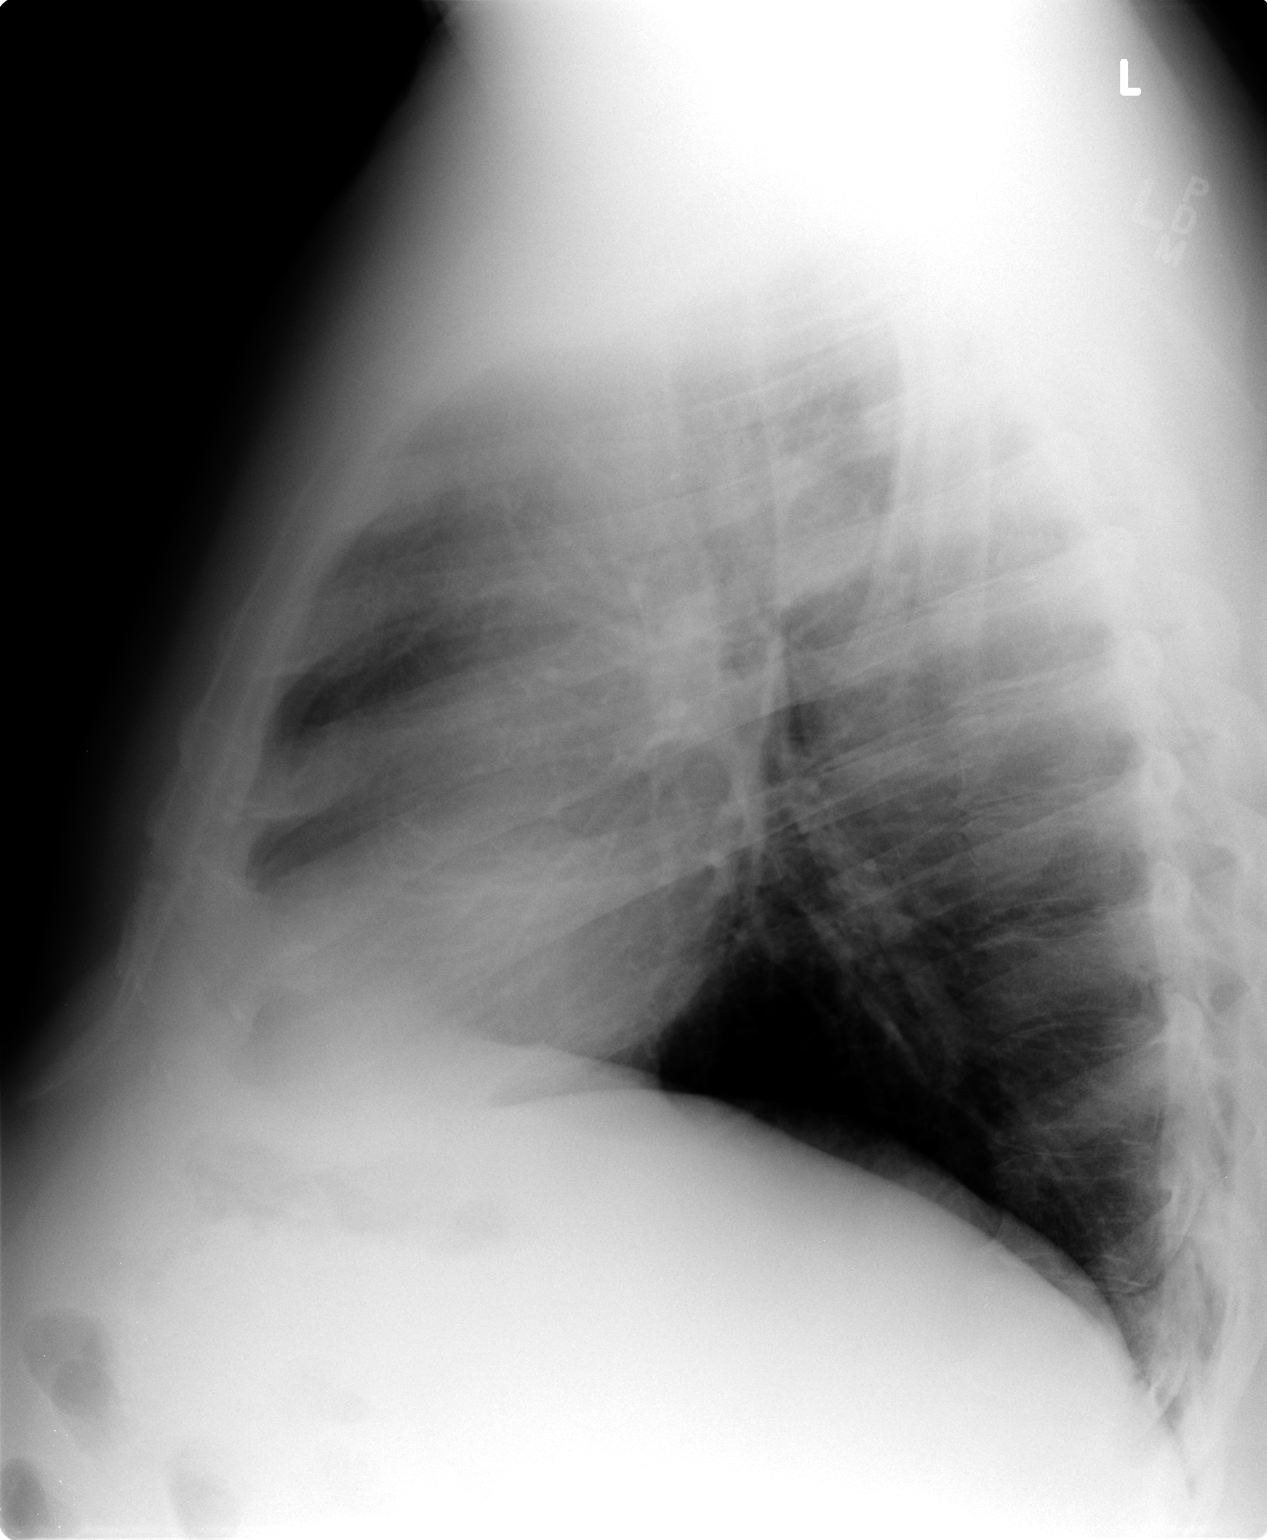

[2 of 2 positions shown; findings below may reference images not displayed]

FINDINGS: The cardiomediastinal silhouette is within normal limits. The lungs
are well inflated and clear. There is no evidence of pleural
effusion or pneumothorax. No acute osseous abnormality is
identified.
IMPRESSION: No active cardiopulmonary disease.

## 2015-03-04 ENCOUNTER — Other Ambulatory Visit (INDEPENDENT_AMBULATORY_CARE_PROVIDER_SITE_OTHER): Payer: BLUE CROSS/BLUE SHIELD

## 2015-03-04 DIAGNOSIS — R7989 Other specified abnormal findings of blood chemistry: Secondary | ICD-10-CM | POA: Diagnosis not present

## 2015-03-04 DIAGNOSIS — Z0000 Encounter for general adult medical examination without abnormal findings: Secondary | ICD-10-CM | POA: Diagnosis not present

## 2015-03-04 LAB — POCT URINALYSIS DIPSTICK
Bilirubin, UA: 2
GLUCOSE UA: NEGATIVE
KETONES UA: NEGATIVE
LEUKOCYTES UA: NEGATIVE
NITRITE UA: NEGATIVE
PH UA: 5
RBC UA: NEGATIVE
SPEC GRAV UA: 1.025
Urobilinogen, UA: 0.2

## 2015-03-04 LAB — CBC WITH DIFFERENTIAL/PLATELET
BASOS PCT: 0.4 % (ref 0.0–3.0)
Basophils Absolute: 0 10*3/uL (ref 0.0–0.1)
Eosinophils Absolute: 0.3 10*3/uL (ref 0.0–0.7)
Eosinophils Relative: 3.9 % (ref 0.0–5.0)
HEMATOCRIT: 44.6 % (ref 39.0–52.0)
Hemoglobin: 15.5 g/dL (ref 13.0–17.0)
Lymphocytes Relative: 19.6 % (ref 12.0–46.0)
Lymphs Abs: 1.3 10*3/uL (ref 0.7–4.0)
MCHC: 34.7 g/dL (ref 30.0–36.0)
MCV: 87.2 fl (ref 78.0–100.0)
MONOS PCT: 10 % (ref 3.0–12.0)
Monocytes Absolute: 0.7 10*3/uL (ref 0.1–1.0)
Neutro Abs: 4.4 10*3/uL (ref 1.4–7.7)
Neutrophils Relative %: 66.1 % (ref 43.0–77.0)
PLATELETS: 215 10*3/uL (ref 150.0–400.0)
RBC: 5.12 Mil/uL (ref 4.22–5.81)
RDW: 13.8 % (ref 11.5–15.5)
WBC: 6.7 10*3/uL (ref 4.0–10.5)

## 2015-03-04 LAB — HEPATIC FUNCTION PANEL
ALT: 76 U/L — AB (ref 0–53)
AST: 32 U/L (ref 0–37)
Albumin: 4.3 g/dL (ref 3.5–5.2)
Alkaline Phosphatase: 69 U/L (ref 39–117)
Bilirubin, Direct: 0.1 mg/dL (ref 0.0–0.3)
TOTAL PROTEIN: 6.3 g/dL (ref 6.0–8.3)
Total Bilirubin: 0.5 mg/dL (ref 0.2–1.2)

## 2015-03-04 LAB — LIPID PANEL
Cholesterol: 167 mg/dL (ref 0–200)
HDL: 31 mg/dL — ABNORMAL LOW (ref >39.00)
NonHDL: 136
TRIGLYCERIDES: 241 mg/dL — AB (ref 0.0–149.0)
Total CHOL/HDL Ratio: 5
VLDL: 48.2 mg/dL — AB (ref 0.0–40.0)

## 2015-03-04 LAB — PSA: PSA: 1.09 ng/mL (ref 0.10–4.00)

## 2015-03-04 LAB — BASIC METABOLIC PANEL
BUN: 26 mg/dL — ABNORMAL HIGH (ref 6–23)
CO2: 28 meq/L (ref 19–32)
CREATININE: 1.05 mg/dL (ref 0.40–1.50)
Calcium: 9.3 mg/dL (ref 8.4–10.5)
Chloride: 104 mEq/L (ref 96–112)
GFR: 78.93 mL/min (ref >60.00)
GLUCOSE: 128 mg/dL — AB (ref 70–99)
POTASSIUM: 4.5 meq/L (ref 3.5–5.1)
Sodium: 140 mEq/L (ref 135–145)

## 2015-03-04 LAB — LDL CHOLESTEROL, DIRECT: Direct LDL: 93 mg/dL

## 2015-03-04 LAB — TSH: TSH: 2.08 u[IU]/mL (ref 0.35–4.50)

## 2015-03-11 ENCOUNTER — Encounter: Payer: Self-pay | Admitting: Adult Health

## 2015-03-11 ENCOUNTER — Ambulatory Visit (INDEPENDENT_AMBULATORY_CARE_PROVIDER_SITE_OTHER): Payer: BLUE CROSS/BLUE SHIELD | Admitting: Adult Health

## 2015-03-11 VITALS — BP 118/82 | Temp 98.1°F | Wt 302.5 lb

## 2015-03-11 DIAGNOSIS — I10 Essential (primary) hypertension: Secondary | ICD-10-CM

## 2015-03-11 DIAGNOSIS — E669 Obesity, unspecified: Secondary | ICD-10-CM

## 2015-03-11 DIAGNOSIS — E785 Hyperlipidemia, unspecified: Secondary | ICD-10-CM | POA: Diagnosis not present

## 2015-03-11 DIAGNOSIS — Z0000 Encounter for general adult medical examination without abnormal findings: Secondary | ICD-10-CM | POA: Diagnosis not present

## 2015-03-11 NOTE — Addendum Note (Signed)
Addended by: Apolinar Junes on: 03/11/2015 01:42 PM   Modules accepted: Miquel Dunn

## 2015-03-11 NOTE — Progress Notes (Addendum)
Subjective:    Patient ID: Richard Martinez, male    DOB: [birth year], 54 y.o.   MRN: [id number]  HPI 54 year old male who is a patient of Dr. Sherren Mocha. He has a  has a past medical history of GERD (gastroesophageal reflux disease); Hyperlipidemia; Hypertension; Gout; and Obesity. He also has a history of elevated liver function studies consistent with fatty liver He is adhering to a diet and exercising regularly. There has been some modest weight loss over the past several months.  He feels as though the HCTZ makes him feel dizzy at times when he is outside in the sun. He is taking the medication after his morning workout and is staying well hydrated.   His last colonoscopy was in 2015. He does all of his periodic health maintenance exams and is up to date on vaccinations. He eats healthy and exercises daily.   Review of Systems  Constitutional: Negative.   Eyes: Negative.   Respiratory: Negative.   Gastrointestinal: Negative.   Endocrine: Negative.   Genitourinary: Negative.   Musculoskeletal: Negative.   Skin: Negative.   Allergic/Immunologic: Negative.   Neurological: Negative.   Hematological: Negative.    Past Medical History  Diagnosis Date  . GERD (gastroesophageal reflux disease)   . Hyperlipidemia   . Hypertension   . Gout   . Obesity     History   Social History  . Marital Status: Married    Spouse Name: N/A  . Number of Children: N/A  . Years of Education: N/A   Occupational History  . Not on file.   Social History Main Topics  . Smoking status: Never Smoker   . Smokeless tobacco: Never Used  . Alcohol Use: Yes     Comment: 3 per week  . Drug Use: No  . Sexual Activity: Not on file   Other Topics Concern  . Not on file   Social History Narrative    Past Surgical History  Procedure Laterality Date  . Knee arthroscopy Bilateral   . Rhinoplasty  1989    Family History  Problem Relation Age of Onset  . Diabetes      fhx  . Hypertension      fhx    . Colon cancer Paternal Grandfather 32  . Esophageal cancer Neg Hx   . Stomach cancer Neg Hx   . Rectal cancer Neg Hx     No Known Allergies  Current Outpatient Prescriptions on File Prior to Visit  Medication Sig Dispense Refill  . allopurinol (ZYLOPRIM) 300 MG tablet TAKE ONE TABLET BY MOUTH ONE TIME DAILY 90 tablet 1  . amLODipine (NORVASC) 5 MG tablet Take 1 tablet (5 mg total) by mouth daily. 30 tablet 11  . etodolac (LODINE) 400 MG tablet TAKE ONE TABLET BY MOUTH ONE TIME DAILY 30 tablet 4  . hydrochlorothiazide (HYDRODIURIL) 12.5 MG tablet Take 1 tablet (12.5 mg total) by mouth daily. 90 tablet 3  . lisinopril (PRINIVIL,ZESTRIL) 40 MG tablet TAKE ONE TABLET BY MOUTH TWICE DAILY  (Patient taking differently: TAKE ONE TABLET BY MOUTH DAILY) 60 tablet 5  . omeprazole (PRILOSEC) 40 MG capsule Take 1 capsule (40 mg total) by mouth daily. 30 capsule 5   No current facility-administered medications on file prior to visit.    BP 118/82 mmHg  Temp(Src) 98.1 F (36.7 C) (Oral)  Wt 302 lb 8 oz (137.213 kg)       Objective:   Physical Exam  Constitutional: He is oriented  to person, place, and time. He appears well-developed and well-nourished. No distress.  obese  HENT:  Head: Normocephalic and atraumatic.  Right Ear: External ear normal.  Left Ear: External ear normal.  Nose: Nose normal.  Mouth/Throat: Oropharynx is clear and moist. No oropharyngeal exudate.  TM's visualized, no cerumen impaction  Eyes: Conjunctivae and EOM are normal. Pupils are equal, round, and reactive to light. Right eye exhibits no discharge. Left eye exhibits no discharge.  Neck: Normal range of motion. Neck supple. No JVD present. No thyromegaly present.  Cardiovascular: Normal rate, regular rhythm, normal heart sounds and intact distal pulses.  Exam reveals no gallop and no friction rub.   No murmur heard. Pulmonary/Chest: Effort normal and breath sounds normal. No respiratory distress. He has no  wheezes. He has no rales. He exhibits no tenderness.  Abdominal: Soft. Bowel sounds are normal. He exhibits no distension and no mass. There is no tenderness. There is no rebound and no guarding.  obese  Musculoskeletal: Normal range of motion. He exhibits no edema or tenderness.  Knee pain  Lymphadenopathy:    He has no cervical adenopathy.  Neurological: He is alert and oriented to person, place, and time. He has normal reflexes. He displays normal reflexes. No cranial nerve deficit. He exhibits normal muscle tone. Coordination normal.  Skin: Skin is warm and dry. No rash noted. He is not diaphoretic. No erythema. No pallor.  Scattered moles throughout torso. He has one spot on chest that he was worried about. Does not appear malignant during exam. Continue to monitor.   Psychiatric: He has a normal mood and affect. His behavior is normal. Judgment and thought content normal.  Nursing note and vitals reviewed.      Assessment & Plan:  1. Routine general medical examination at a health care facility - Benign exam - Follow up in one year for CPE or sooner if needed - He understands the importance of eating healthy and exercising to lose weight - Reviewed labs in detail 2. Essential hypertension - Controlled on current medication - he will try taking his HCTZ later in the day to see if this helps with his dizziness  3. Hyperlipidemia - Triglycerides continue to be elevated even though they have dropped. HDL is low. He is encouraged to diet more and exercise.   4. Obesity - He understands the importance of eating healthy and exercising to lose weight - Spoke about portion control.

## 2015-03-11 NOTE — Progress Notes (Signed)
Pre visit review using our clinic review tool, if applicable. No additional management support is needed unless otherwise documented below in the visit note. 

## 2015-03-11 NOTE — Patient Instructions (Signed)
It was great seeing you again!   Please keep an eye on that mole on your chest. If it grows or gets darker,please let me know.   Also, let me know if you continue to feel dizzy. Try taking the HCTZ at night.

## 2015-03-23 ENCOUNTER — Other Ambulatory Visit: Payer: Self-pay | Admitting: Family Medicine

## 2015-04-15 ENCOUNTER — Other Ambulatory Visit: Payer: Self-pay | Admitting: Family Medicine

## 2015-05-17 ENCOUNTER — Other Ambulatory Visit: Payer: Self-pay | Admitting: Family Medicine

## 2015-05-27 ENCOUNTER — Other Ambulatory Visit: Payer: Self-pay | Admitting: Family Medicine

## 2015-06-12 ENCOUNTER — Other Ambulatory Visit: Payer: Self-pay | Admitting: Family Medicine

## 2015-06-24 ENCOUNTER — Ambulatory Visit (INDEPENDENT_AMBULATORY_CARE_PROVIDER_SITE_OTHER): Payer: BLUE CROSS/BLUE SHIELD

## 2015-06-24 DIAGNOSIS — Z23 Encounter for immunization: Secondary | ICD-10-CM

## 2015-06-25 ENCOUNTER — Other Ambulatory Visit: Payer: Self-pay | Admitting: Family Medicine

## 2015-07-11 IMAGING — US US RENAL
1 series · 14 of 25 positions shown · non-contrast
Comparison: None.

CLINICAL DATA: Difficult to control hypertension despite medical
management, diabetes, elevated cholesterol, obesity

EXAM:
RENAL/URINARY TRACT ULTRASOUND
RENAL DUPLEX ULTRASOUND

[Series 1: us renal · 0.40mm/px · 14 of 82 slices shown]
[im 1/82]
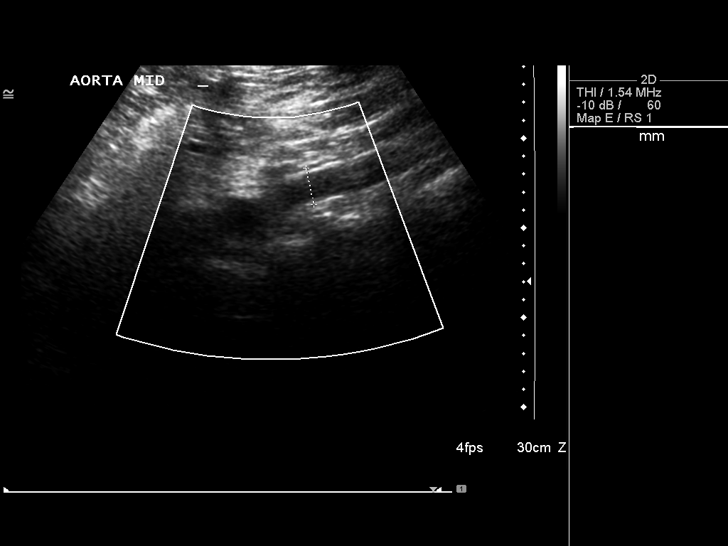
[im 7/82]
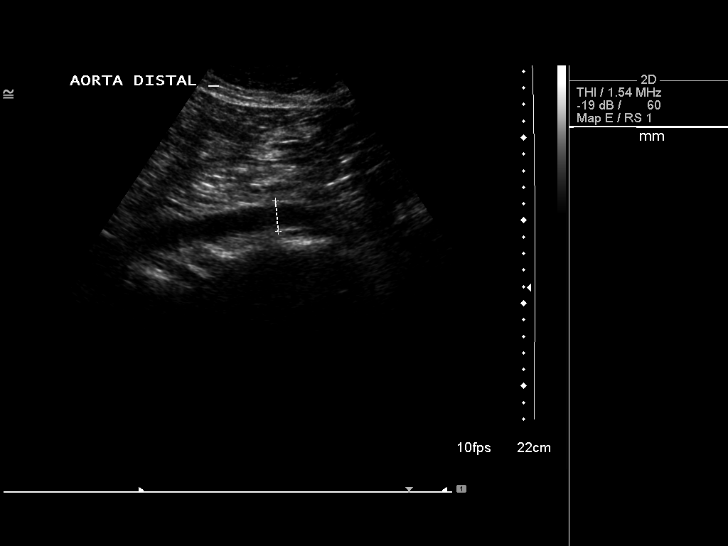
[im 14/82]
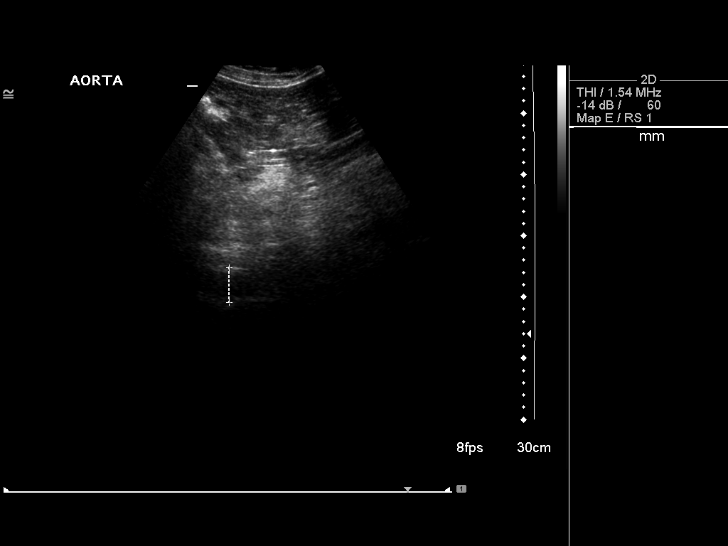
[im 21/82]
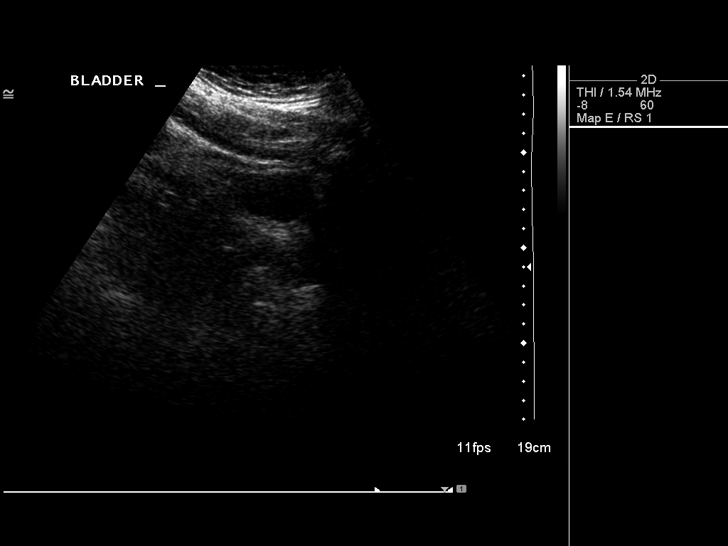
[im 28/82]
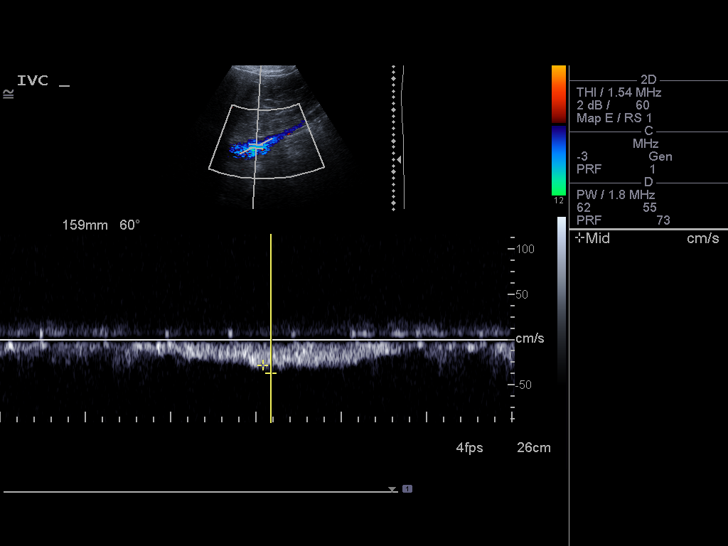
[im 31/82]
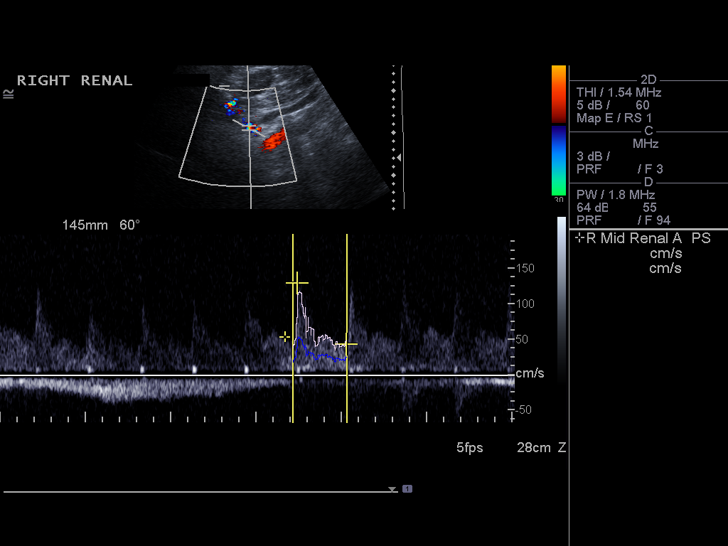
[im 38/82]
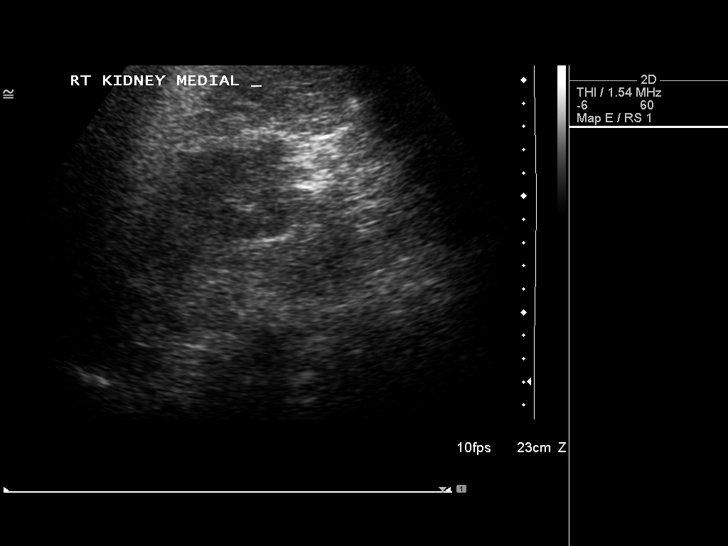
[im 44/82]
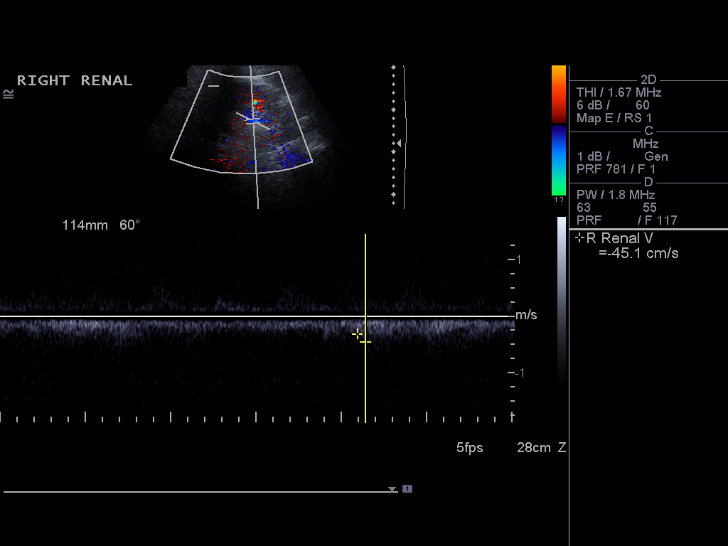
[im 51/82]
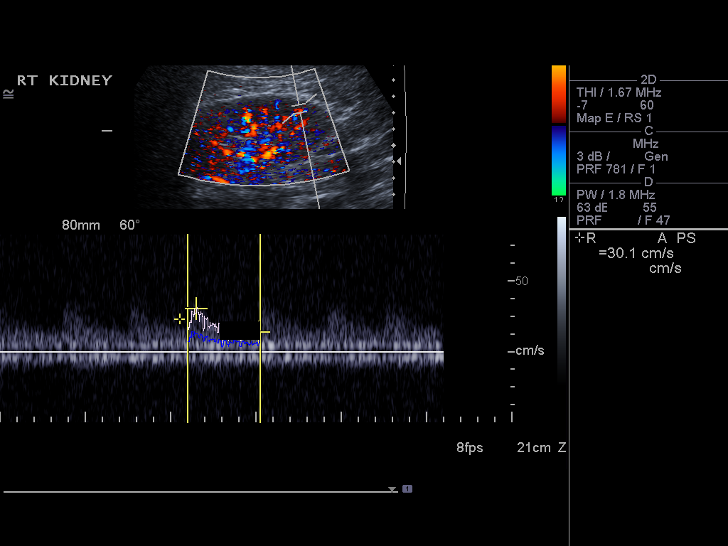
[im 55/82]
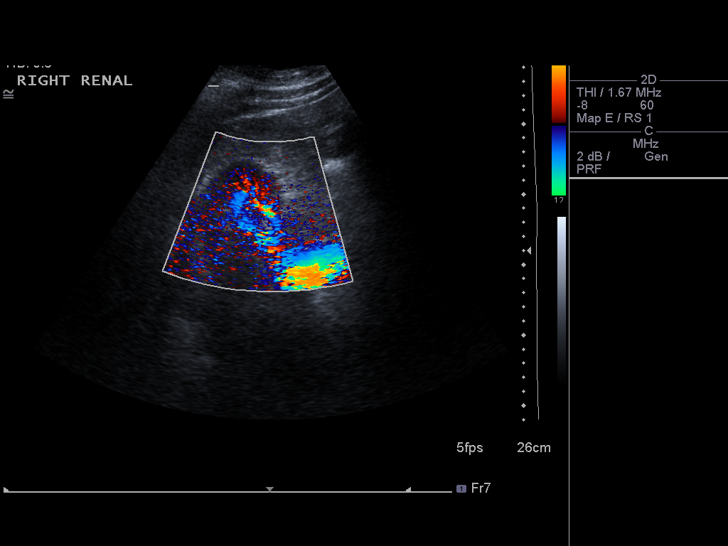
[im 61/82]
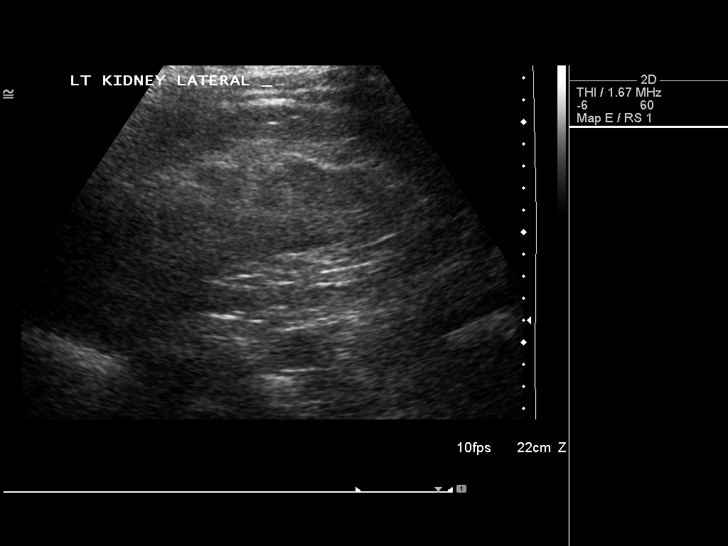
[im 68/82]
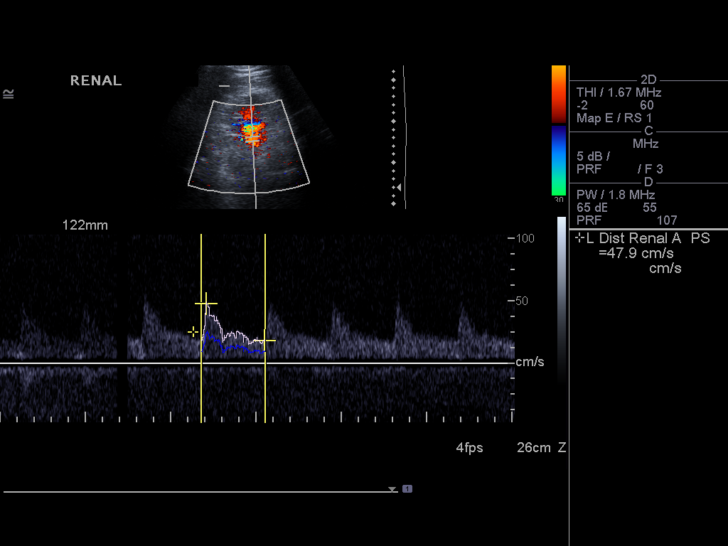
[im 75/82]
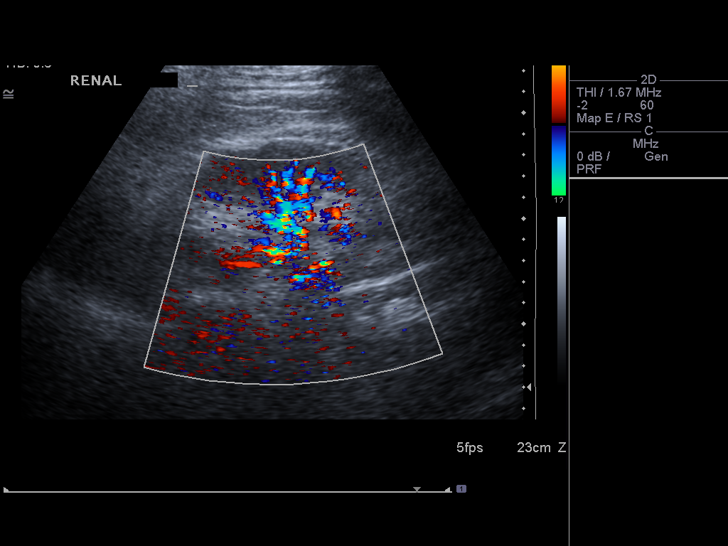
[im 82/82]
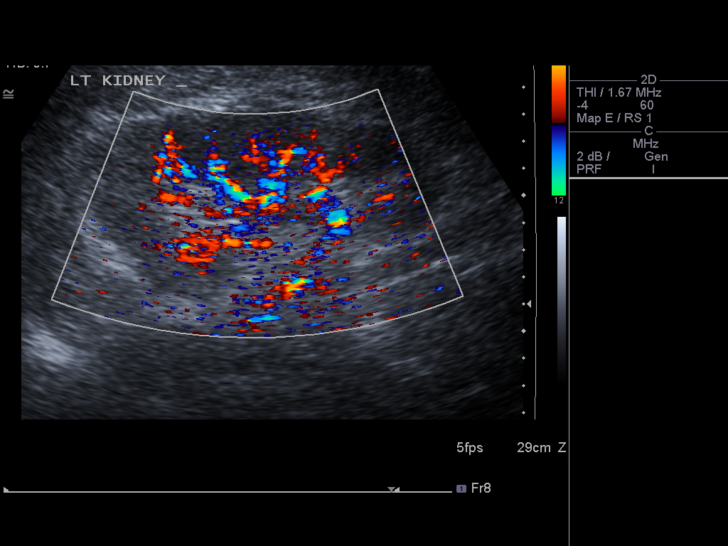

[14 of 25 positions shown; findings below may reference images not displayed]

FINDINGS: Right Kidney:

Length: 11.7 cm. Echogenicity within normal limits. No mass or
hydronephrosis visualized.

Left Kidney:

Length: 13.1 cm. Echogenicity within normal limits. No mass or
hydronephrosis visualized.

Bladder:  Nondistended.  Grossly normal.

RENAL DUPLEX ULTRASOUND

Right Renal Artery Velocities:

Origin:  138 cm/sec

Mid:  129 cm/sec

Hilum:  125 cm/sec

Interlobar:  65 cm/sec

Arcuate:  36 Cm/sec

Left Renal Artery Velocities:

Origin:  96 cm/sec

Mid:  134 cm/sec

Hilum:  48 cm/sec

Interlobar:  48 cm/sec

Arcuate:  38 Cm/sec

Aortic Velocity:  83 Cm/sec

Right Renal-Aortic Ratios:

Origin:

Mid:

Hilum:

Interlobar:

Arcuate:

Left Renal-Aortic Ratios:

Origin:

Mid:

Hilum:

Interlobar:

Arcuate:
IMPRESSION: No significant renal artery stenosis detected by velocity and ratio
criteria.

No acute finding by renal ultrasound.

## 2015-10-04 ENCOUNTER — Other Ambulatory Visit: Payer: Self-pay | Admitting: Family Medicine

## 2015-10-16 ENCOUNTER — Telehealth: Payer: Self-pay | Admitting: Family Medicine

## 2015-10-16 ENCOUNTER — Other Ambulatory Visit: Payer: Self-pay | Admitting: Adult Health

## 2015-10-16 ENCOUNTER — Telehealth: Payer: Self-pay | Admitting: Adult Health

## 2015-10-16 MED ORDER — OSELTAMIVIR PHOSPHATE 75 MG PO CAPS: 75.0000 mg | ORAL_CAPSULE | Freq: Two times a day (BID) | ORAL | Status: DC

## 2015-10-16 NOTE — Telephone Encounter (Signed)
Can we see what his symptoms are and how long he has had his symptoms.

## 2015-10-16 NOTE — Telephone Encounter (Signed)
Duplicate message; see other telephone call.

## 2015-10-16 NOTE — Telephone Encounter (Signed)
Called and spoke with pt and pt states he started with symptoms this am; headache, sore throat, nausea, coughing with no congestion, chills

## 2015-10-16 NOTE — Telephone Encounter (Signed)
Patient ask for a prescription for Tamiflu send to  [id number] - [place], [place] - [address] [phone] (Phone) [phone] (Fax)

## 2015-10-16 NOTE — Telephone Encounter (Signed)
Pt states his child was diagnosed with the flu, and his wife just got back from her dr, she has the flu and  pt would like to know if Tommi Rumps would call him in [place] flu. Pt is confident   he is getting the flu as well.  Advised pt he may need office visit. Pt was insistent I ask please.  [facility] [place]

## 2015-10-17 NOTE — Telephone Encounter (Signed)
error 

## 2015-11-01 ENCOUNTER — Other Ambulatory Visit: Payer: Self-pay | Admitting: Adult Health

## 2015-12-16 ENCOUNTER — Encounter: Payer: Self-pay | Admitting: Adult Health

## 2015-12-16 ENCOUNTER — Ambulatory Visit (INDEPENDENT_AMBULATORY_CARE_PROVIDER_SITE_OTHER): Payer: BLUE CROSS/BLUE SHIELD | Admitting: Adult Health

## 2015-12-16 VITALS — BP 120/88 | Temp 98.3°F | Ht 72.0 in | Wt 311.3 lb

## 2015-12-16 DIAGNOSIS — R05 Cough: Secondary | ICD-10-CM

## 2015-12-16 DIAGNOSIS — R059 Cough, unspecified: Secondary | ICD-10-CM

## 2015-12-16 MED ORDER — PREDNISONE 10 MG PO TABS
ORAL_TABLET | ORAL | Status: DC
Start: 2015-12-16 — End: 2015-12-29

## 2015-12-16 MED ORDER — HYDROCODONE-HOMATROPINE 5-1.5 MG/5ML PO SYRP: 5.0000 mL | ORAL_SOLUTION | Freq: Three times a day (TID) | ORAL | Status: DC | PRN

## 2015-12-16 NOTE — Progress Notes (Signed)
Subjective:    Patient ID: Richard Martinez, male    DOB: Mar 07, 1961, 55 y.o.   MRN: [id number]  Cough This is a new problem. The current episode started yesterday. The problem has been unchanged. The cough is non-productive. Associated symptoms include ear congestion, postnasal drip and rhinorrhea. Pertinent negatives include no ear pain, fever, headaches, nasal congestion, sore throat, shortness of breath, sweats or wheezing. He has tried prescription cough suppressant and OTC cough suppressant for the symptoms. The treatment provided mild relief. His past medical history is significant for bronchitis and pneumonia.    He is leaving tomorrow for a trip to [place].   Review of Systems  Constitutional: Negative for fever.  HENT: Positive for postnasal drip and rhinorrhea. Negative for ear pain and sore throat.   Respiratory: Positive for cough. Negative for shortness of breath and wheezing.   Neurological: Negative for headaches.   Past Medical History  Diagnosis Date  . GERD (gastroesophageal reflux disease)   . Hyperlipidemia   . Hypertension   . Gout   . Obesity     Social History   Social History  . Marital Status: Married    Spouse Name: N/A  . Number of Children: N/A  . Years of Education: N/A   Occupational History  . Not on file.   Social History Main Topics  . Smoking status: Never Smoker   . Smokeless tobacco: Never Used  . Alcohol Use: Yes     Comment: 3 per week  . Drug Use: No  . Sexual Activity: Not on file   Other Topics Concern  . Not on file   Social History Narrative    Past Surgical History  Procedure Laterality Date  . Knee arthroscopy Bilateral   . Rhinoplasty  1989    Family History  Problem Relation Age of Onset  . Diabetes      fhx  . Hypertension      fhx  . Colon cancer Paternal Grandfather 66  . Esophageal cancer Neg Hx   . Stomach cancer Neg Hx   . Rectal cancer Neg Hx     No Known Allergies  Current Outpatient  Prescriptions on File Prior to Visit  Medication Sig Dispense Refill  . allopurinol (ZYLOPRIM) 300 MG tablet TAKE ONE TABLET BY MOUTH ONE TIME DAILY ** INS ONLY ALLOW 30 DAYS SUPPLY** 90 tablet 1  . amLODipine (NORVASC) 5 MG tablet TAKE 1 TABLET BY MOUTH EVERY DAY 30 tablet 5  . etodolac (LODINE) 400 MG tablet TAKE ONE TABLET BY MOUTH ONE TIME DAILY 30 tablet 4  . hydrochlorothiazide (MICROZIDE) 12.5 MG capsule TAKE ONE CAPSULE BY MOUTH ONE TIME DAILY 90 capsule 3  . lisinopril (PRINIVIL,ZESTRIL) 40 MG tablet TAKE ONE TABLET BY MOUTH TWICE DAILY 180 tablet 1  . omeprazole (PRILOSEC) 40 MG capsule TAKE 1 CAPSULE (40 MG TOTAL) BY MOUTH DAILY. (Patient taking differently: TAKE 1 CAPSULE (40 MG TOTAL) BY MOUTH TWO TIMES DAILY) 30 capsule 5   No current facility-administered medications on file prior to visit.    BP 120/88 mmHg  Temp(Src) 98.3 F (36.8 C) (Oral)  Ht 6' (1.829 m)  Wt 311 lb 4.8 oz (141.205 kg)  BMI 42.21 kg/m2       Objective:   Physical Exam  Constitutional: He is oriented to person, place, and time. He appears well-developed and well-nourished. No distress.  HENT:  Head: Normocephalic and atraumatic.  Right Ear: Hearing, tympanic membrane, external ear and ear canal normal.  Tympanic membrane is not erythematous and not bulging. No decreased hearing is noted.  Left Ear: Hearing, tympanic membrane, external ear and ear canal normal. Tympanic membrane is not erythematous and not bulging. No decreased hearing is noted.  Nose: Mucosal edema and rhinorrhea present. Right sinus exhibits no maxillary sinus tenderness and no frontal sinus tenderness. Left sinus exhibits no maxillary sinus tenderness and no frontal sinus tenderness.  Mouth/Throat: Uvula is midline and oropharynx is clear and moist. No oropharyngeal exudate, posterior oropharyngeal edema or posterior oropharyngeal erythema.  Eyes: Conjunctivae and EOM are normal. Pupils are equal, round, and reactive to light. Right  eye exhibits no discharge. Left eye exhibits no discharge.  Neck: Normal range of motion. Neck supple. No thyromegaly present.  Cardiovascular: Normal rate, regular rhythm, normal heart sounds and intact distal pulses.  Exam reveals no gallop and no friction rub.   No murmur heard. Pulmonary/Chest: Effort normal and breath sounds normal. No respiratory distress. He has no wheezes. He has no rales. He exhibits no tenderness.  Lymphadenopathy:    He has no cervical adenopathy.  Neurological: He is alert and oriented to person, place, and time.  Skin: Skin is warm and dry. No rash noted. He is not diaphoretic. No erythema. No pallor.  Psychiatric: He has a normal mood and affect. His behavior is normal. Judgment and thought content normal.  Nursing note and vitals reviewed.     Assessment & Plan:  1. Cough - His symptoms have been present for less than 24 hours. Doubt anything serious, but since he is leaving for [place], I will have him take prednisone with him. If he doe snot show any improvement in the next 3-4 days then he can start it.  - predniSONE (DELTASONE) 10 MG tablet; 40 mg x 3 days, 20 mg x 3 days, 10 mg x 3 days  Dispense: 21 tablet; Refill: 0 - HYDROcodone-homatropine (HYCODAN) 5-1.5 MG/5ML syrup; Take 5 mLs by mouth every 8 (eight) hours as needed for cough.  Dispense: 120 mL; Refill: 0 - Follow up if no improvement - Continue with Mucinex and Flonase  Dorothyann Peng, NP

## 2015-12-16 NOTE — Patient Instructions (Signed)
I have sent in prednisone, take as direted  40 mg x 3 days, 20 mg x 3 days, 10 mg x 3 days.   Use the cough syrup at night - if needed  Continue with over the counter therapies

## 2015-12-29 ENCOUNTER — Encounter: Payer: Self-pay | Admitting: Family Medicine

## 2015-12-29 ENCOUNTER — Ambulatory Visit (INDEPENDENT_AMBULATORY_CARE_PROVIDER_SITE_OTHER): Payer: BLUE CROSS/BLUE SHIELD | Admitting: Family Medicine

## 2015-12-29 VITALS — BP 140/90 | HR 83 | Temp 99.2°F | Wt 308.3 lb

## 2015-12-29 DIAGNOSIS — J069 Acute upper respiratory infection, unspecified: Secondary | ICD-10-CM | POA: Diagnosis not present

## 2015-12-29 DIAGNOSIS — R05 Cough: Secondary | ICD-10-CM | POA: Diagnosis not present

## 2015-12-29 DIAGNOSIS — R059 Cough, unspecified: Secondary | ICD-10-CM

## 2015-12-29 MED ORDER — BENZONATATE 100 MG PO CAPS
100.0000 mg | ORAL_CAPSULE | Freq: Three times a day (TID) | ORAL | Status: DC
Start: 2015-12-29 — End: 2016-03-01

## 2015-12-29 MED ORDER — AZITHROMYCIN 250 MG PO TABS: ORAL_TABLET | ORAL | Status: DC

## 2015-12-29 NOTE — Progress Notes (Signed)
Subjective:    Patient ID: Richard Martinez, male    DOB: Oct 10, 1960, 56 y.o.   MRN: [id number]  HPI Mr. Mariotti is a 55 year old male who presents today acutely ill with a fever and cough that is nonproductive.  Associated symptoms of  congestion, sinus pressure, ear pressure, fever per patient report of T-max of 99.5, chills, and sweats. He denies wheezing, N/V/D, GERD, myalgias, recent antibiotic use, recent sick contact exposure, history of asthma or bronchitis. He is a nonsmoker. Symptom of cough has been present for 2 weeks. He was seen on 12/16/2015 for a cough and was treated with a prednisone taper and hycodan as needed. This provided moderate,  temporary relief. He recently returned from a trip to [place] and symptoms were noted while on his trip and he tried Robitussin DM which provided limited benefit. Symptoms noted from previous visit on 12/16/2015 improved but never resolved and are progressively worse.   Review of Systems  Constitutional: Positive for fever and chills.  HENT: Positive for congestion, ear pain, postnasal drip, rhinorrhea and sinus pressure.   Eyes: Negative for visual disturbance.  Respiratory: Positive for cough. Negative for wheezing.   Cardiovascular: Negative for chest pain, palpitations and leg swelling.  Gastrointestinal: Negative for nausea, vomiting, abdominal pain and diarrhea.  Musculoskeletal: Negative for myalgias.  Skin: Negative for rash.  Allergic/Immunologic: Positive for environmental allergies.  Neurological: Negative for dizziness, light-headedness and headaches.   Past Medical History  Diagnosis Date  . GERD (gastroesophageal reflux disease)   . Hyperlipidemia   . Hypertension   . Gout   . Obesity      Social History   Social History  . Marital Status: Married    Spouse Name: N/A  . Number of Children: N/A  . Years of Education: N/A   Occupational History  . Not on file.   Social History Main Topics  . Smoking status: Never Smoker     . Smokeless tobacco: Never Used  . Alcohol Use: Yes     Comment: 3 per week  . Drug Use: No  . Sexual Activity: Not on file   Other Topics Concern  . Not on file   Social History Narrative    Past Surgical History  Procedure Laterality Date  . Knee arthroscopy Bilateral   . Rhinoplasty  1989    Family History  Problem Relation Age of Onset  . Diabetes      fhx  . Hypertension      fhx  . Colon cancer Paternal Grandfather 23  . Esophageal cancer Neg Hx   . Stomach cancer Neg Hx   . Rectal cancer Neg Hx     No Known Allergies  Current Outpatient Prescriptions on File Prior to Visit  Medication Sig Dispense Refill  . allopurinol (ZYLOPRIM) 300 MG tablet TAKE ONE TABLET BY MOUTH ONE TIME DAILY ** INS ONLY ALLOW 30 DAYS SUPPLY** 90 tablet 1  . amLODipine (NORVASC) 5 MG tablet TAKE 1 TABLET BY MOUTH EVERY DAY 30 tablet 5  . etodolac (LODINE) 400 MG tablet TAKE ONE TABLET BY MOUTH ONE TIME DAILY 30 tablet 4  . hydrochlorothiazide (MICROZIDE) 12.5 MG capsule TAKE ONE CAPSULE BY MOUTH ONE TIME DAILY 90 capsule 3  . HYDROcodone-homatropine (HYCODAN) 5-1.5 MG/5ML syrup Take 5 mLs by mouth every 8 (eight) hours as needed for cough. 120 mL 0  . lisinopril (PRINIVIL,ZESTRIL) 40 MG tablet TAKE ONE TABLET BY MOUTH TWICE DAILY 180 tablet 1  . omeprazole (  PRILOSEC) 40 MG capsule TAKE 1 CAPSULE (40 MG TOTAL) BY MOUTH DAILY. (Patient taking differently: TAKE 1 CAPSULE (40 MG TOTAL) BY MOUTH TWO TIMES DAILY) 30 capsule 5   No current facility-administered medications on file prior to visit.    BP 140/90 mmHg  Pulse 83  Temp(Src) 99.2 F (37.3 C)  Wt 308 lb 4.8 oz (139.844 kg)  SpO2 95%     Objective:   Physical Exam  Constitutional: He is oriented to person, place, and time. He appears well-developed and well-nourished.  HENT:  Right Ear: Tympanic membrane normal.  Left Ear: Tympanic membrane normal.  Nose: Rhinorrhea present. Right sinus exhibits no maxillary sinus tenderness  and no frontal sinus tenderness. Left sinus exhibits no maxillary sinus tenderness and no frontal sinus tenderness.  Mouth/Throat: Mucous membranes are normal. No oropharyngeal exudate.  Increased cerumen noted bilaterally. Mild post oropharyngeal erythema noted. Erythematous nasal membranes  Eyes: Pupils are equal, round, and reactive to light.  Neck: Neck supple.  Cardiovascular: Normal rate and regular rhythm.   Pulmonary/Chest: Effort normal and breath sounds normal. He has no wheezes. He has no rales.  Lymphadenopathy:    He has no cervical adenopathy.  Neurological: He is alert and oriented to person, place, and time.  Skin: Skin is warm and dry. No rash noted.  Psychiatric: He has a normal mood and affect. His behavior is normal. Judgment and thought content normal.      Assessment & Plan:  1. Acute upper respiratory infection Exam and history support empiric treatment for bacterial respiratory infection. Symptom of cough has been present for 2 weeks and patient is exhibiting double sickening after return of symptoms that temporarily improved.   - azithromycin (ZITHROMAX) 250 MG tablet; Take 2 tablets today and one tablet daily for 4 days.  Dispense: 6 tablet; Refill: 0  2. Cough  - benzonatate (TESSALON) 100 MG capsule; Take 1 capsule (100 mg total) by mouth 3 (three) times daily.  Dispense: 20 capsule; Refill: 0  Advised patient on supportive measures:  Get rest, drink plenty of fluids, and use tylenol or ibuprofen as needed for pain. Follow up if fever >101, if symptoms worsen or if symptoms are not improved in 3-4 days. Patient verbalizes understanding.   Delano Metz, FNP-C

## 2015-12-29 NOTE — Patient Instructions (Signed)
Please take azithromycin as directed and use benzonatate for cough as needed.  Get rest, drink plenty of fluids, and use tylenol or ibuprofen as needed for pain. Follow up if fever >101, if symptoms worsen or if symptoms are not improved in 3-4 days.   Upper Respiratory Infection, Adult Most upper respiratory infections (URIs) are a viral infection of the air passages leading to the lungs. A URI affects the nose, throat, and upper air passages. The most common type of URI is nasopharyngitis and is typically referred to as "the common cold." URIs run their course and usually go away on their own. Most of the time, a URI does not require medical attention, but sometimes a bacterial infection in the upper airways can follow a viral infection. This is called a secondary infection. Sinus and middle ear infections are common types of secondary upper respiratory infections. Bacterial pneumonia can also complicate a URI. A URI can worsen asthma and chronic obstructive pulmonary disease (COPD). Sometimes, these complications can require emergency medical care and may be life threatening.  CAUSES Almost all URIs are caused by viruses. A virus is a type of germ and can spread from one person to another.  RISKS FACTORS You may be at risk for a URI if:   You smoke.   You have chronic heart or lung disease.  You have a weakened defense (immune) system.   You are very young or very old.   You have nasal allergies or asthma.  You work in crowded or poorly ventilated areas.  You work in health care facilities or schools. SIGNS AND SYMPTOMS  Symptoms typically develop 2-3 days after you come in contact with a cold virus. Most viral URIs last 7-10 days. However, viral URIs from the influenza virus (flu virus) can last 14-18 days and are typically more severe. Symptoms may include:   Runny or stuffy (congested) nose.   Sneezing.   Cough.   Sore throat.   Headache.   Fatigue.   Fever.    Loss of appetite.   Pain in your forehead, behind your eyes, and over your cheekbones (sinus pain).  Muscle aches.  DIAGNOSIS  Your health care provider may diagnose a URI by:  Physical exam.  Tests to check that your symptoms are not due to another condition such as:  Strep throat.  Sinusitis.  Pneumonia.  Asthma. TREATMENT  A URI goes away on its own with time. It cannot be cured with medicines, but medicines may be prescribed or recommended to relieve symptoms. Medicines may help:  Reduce your fever.  Reduce your cough.  Relieve nasal congestion. HOME CARE INSTRUCTIONS   Take medicines only as directed by your health care provider.   Gargle warm saltwater or take cough drops to comfort your throat as directed by your health care provider.  Use a warm mist humidifier or inhale steam from a shower to increase air moisture. This may make it easier to breathe.  Drink enough fluid to keep your urine clear or pale yellow.   Eat soups and other clear broths and maintain good nutrition.   Rest as needed.   Return to work when your temperature has returned to normal or as your health care provider advises. You may need to stay home longer to avoid infecting others. You can also use a face mask and careful hand washing to prevent spread of the virus.  Increase the usage of your inhaler if you have asthma.   Do not use any  tobacco products, including cigarettes, chewing tobacco, or electronic cigarettes. If you need help quitting, ask your health care provider. PREVENTION  The best way to protect yourself from getting a cold is to practice good hygiene.   Avoid oral or hand contact with people with cold symptoms.   Wash your hands often if contact occurs.  There is no clear evidence that vitamin C, vitamin E, echinacea, or exercise reduces the chance of developing a cold. However, it is always recommended to get plenty of rest, exercise, and practice good  nutrition.  SEEK MEDICAL CARE IF:   You are getting worse rather than better.   Your symptoms are not controlled by medicine.   You have chills.  You have worsening shortness of breath.  You have brown or red mucus.  You have yellow or brown nasal discharge.  You have pain in your face, especially when you bend forward.  You have a fever.  You have swollen neck glands.  You have pain while swallowing.  You have white areas in the back of your throat. SEEK IMMEDIATE MEDICAL CARE IF:   You have severe or persistent:  Headache.  Ear pain.  Sinus pain.  Chest pain.  You have chronic lung disease and any of the following:  Wheezing.  Prolonged cough.  Coughing up blood.  A change in your usual mucus.  You have a stiff neck.  You have changes in your:  Vision.  Hearing.  Thinking.  Mood. MAKE SURE YOU:   Understand these instructions.  Will watch your condition.  Will get help right away if you are not doing well or get worse.   This information is not intended to replace advice given to you by your health care provider. Make sure you discuss any questions you have with your health care provider.   Document Released: 01/26/2001 Document Revised: 12/17/2014 Document Reviewed: 11/07/2013 Elsevier Interactive Patient Education [employer].

## 2016-01-04 ENCOUNTER — Other Ambulatory Visit: Payer: Self-pay | Admitting: Family Medicine

## 2016-03-01 ENCOUNTER — Encounter: Payer: Self-pay | Admitting: Internal Medicine

## 2016-03-01 ENCOUNTER — Ambulatory Visit (INDEPENDENT_AMBULATORY_CARE_PROVIDER_SITE_OTHER): Payer: BLUE CROSS/BLUE SHIELD | Admitting: Internal Medicine

## 2016-03-01 VITALS — BP 110/82 | HR 84 | Temp 98.1°F | Ht 72.0 in | Wt 307.0 lb

## 2016-03-01 DIAGNOSIS — I10 Essential (primary) hypertension: Secondary | ICD-10-CM | POA: Diagnosis not present

## 2016-03-01 DIAGNOSIS — Z4802 Encounter for removal of sutures: Secondary | ICD-10-CM

## 2016-03-01 NOTE — Patient Instructions (Signed)

## 2016-03-01 NOTE — Progress Notes (Signed)
Pre visit review using our clinic review tool, if applicable. No additional management support is needed unless otherwise documented below in the visit note. 

## 2016-03-01 NOTE — Progress Notes (Signed)
   Subjective:    Patient ID: Richard Martinez, male    DOB: [birth year], 55 y.o.   MRN: [id number]  HPI  55 year old patient seen today for suture removal He has a history of essential hypertension No other concerns or complaints  Past Medical History  Diagnosis Date  . GERD (gastroesophageal reflux disease)   . Hyperlipidemia   . Hypertension   . Gout   . Obesity      Social History   Social History  . Marital Status: Married    Spouse Name: N/A  . Number of Children: N/A  . Years of Education: N/A   Occupational History  . Not on file.   Social History Main Topics  . Smoking status: Never Smoker   . Smokeless tobacco: Never Used  . Alcohol Use: Yes     Comment: 3 per week  . Drug Use: No  . Sexual Activity: Not on file   Other Topics Concern  . Not on file   Social History Narrative    Past Surgical History  Procedure Laterality Date  . Knee arthroscopy Bilateral   . Rhinoplasty  1989    Family History  Problem Relation Age of Onset  . Diabetes      fhx  . Hypertension      fhx  . Colon cancer Paternal Grandfather 70  . Esophageal cancer Neg Hx   . Stomach cancer Neg Hx   . Rectal cancer Neg Hx     No Known Allergies  Current Outpatient Prescriptions on File Prior to Visit  Medication Sig Dispense Refill  . allopurinol (ZYLOPRIM) 300 MG tablet TAKE ONE TABLET BY MOUTH ONE TIME DAILY ** INS ONLY ALLOW 30 DAYS SUPPLY** 90 tablet 1  . amLODipine (NORVASC) 5 MG tablet TAKE 1 TABLET BY MOUTH EVERY DAY 30 tablet 5  . etodolac (LODINE) 400 MG tablet TAKE ONE TABLET BY MOUTH ONE TIME DAILY 30 tablet 4  . hydrochlorothiazide (MICROZIDE) 12.5 MG capsule TAKE ONE CAPSULE BY MOUTH ONE TIME DAILY 90 capsule 3  . lisinopril (PRINIVIL,ZESTRIL) 40 MG tablet TAKE ONE TABLET BY MOUTH TWICE DAILY 180 tablet 1  . omeprazole (PRILOSEC) 40 MG capsule TAKE 1 CAPSULE (40 MG TOTAL) BY MOUTH DAILY. (Patient taking differently: TAKE 1 CAPSULE (40 MG TOTAL) BY MOUTH TWO TIMES  DAILY) 30 capsule 5   No current facility-administered medications on file prior to visit.    BP 110/82 mmHg  Pulse 84  Temp(Src) 98.1 F (36.7 C) (Oral)  Ht 6' (1.829 m)  Wt 307 lb (139.254 kg)  BMI 41.63 kg/m2  SpO2 97%     Review of Systems  Skin: Positive for wound.       Objective:   Physical Exam  Constitutional: He appears well-developed and well-nourished.  Weight 307 Blood pressure 110/82  Skin:  Healing laceration noted over the right second MCP joint  A running suture was removed without difficulty          Assessment & Plan:   Suture removal, right hand Essential hypertension, stable.  No change in medical regimen  Follow-up PCP as scheduled  Nyoka Cowden, MD

## 2016-03-25 ENCOUNTER — Other Ambulatory Visit: Payer: Self-pay | Admitting: Family Medicine

## 2016-03-25 NOTE — Telephone Encounter (Signed)
Rx refill sent to pharmacy. 

## 2016-04-05 ENCOUNTER — Other Ambulatory Visit: Payer: Self-pay | Admitting: Family Medicine

## 2016-04-05 NOTE — Telephone Encounter (Signed)
Rx refill sent to pharmacy. 

## 2016-05-09 ENCOUNTER — Other Ambulatory Visit: Payer: Self-pay | Admitting: Adult Health

## 2016-05-26 ENCOUNTER — Telehealth: Payer: Self-pay | Admitting: Emergency Medicine

## 2016-05-26 ENCOUNTER — Other Ambulatory Visit: Payer: Self-pay | Admitting: Family Medicine

## 2016-05-26 NOTE — Telephone Encounter (Signed)
Received paperwork From [facility] to be completed. It is not known that Dr. Sherren Mocha will complete the paper work.  Pt has not been seen by Dr.Tyreke since 05/09/14 to consider please contact pt and schedule appointment.

## 2016-05-28 NOTE — Telephone Encounter (Signed)
Pt has been sch

## 2016-05-31 ENCOUNTER — Ambulatory Visit (INDEPENDENT_AMBULATORY_CARE_PROVIDER_SITE_OTHER): Payer: BLUE CROSS/BLUE SHIELD | Admitting: Family Medicine

## 2016-05-31 ENCOUNTER — Encounter: Payer: Self-pay | Admitting: Family Medicine

## 2016-05-31 VITALS — BP 118/82 | Wt 296.0 lb

## 2016-05-31 DIAGNOSIS — E785 Hyperlipidemia, unspecified: Secondary | ICD-10-CM | POA: Diagnosis not present

## 2016-05-31 DIAGNOSIS — N138 Other obstructive and reflux uropathy: Secondary | ICD-10-CM

## 2016-05-31 DIAGNOSIS — Z23 Encounter for immunization: Secondary | ICD-10-CM

## 2016-05-31 DIAGNOSIS — I10 Essential (primary) hypertension: Secondary | ICD-10-CM | POA: Diagnosis not present

## 2016-05-31 DIAGNOSIS — N401 Enlarged prostate with lower urinary tract symptoms: Secondary | ICD-10-CM

## 2016-05-31 DIAGNOSIS — M1990 Unspecified osteoarthritis, unspecified site: Secondary | ICD-10-CM | POA: Insufficient documentation

## 2016-05-31 LAB — BASIC METABOLIC PANEL
BUN: 28 mg/dL — ABNORMAL HIGH (ref 6–23)
CHLORIDE: 104 meq/L (ref 96–112)
CO2: 26 mEq/L (ref 19–32)
Calcium: 9.9 mg/dL (ref 8.4–10.5)
Creatinine, Ser: 1.18 mg/dL (ref 0.40–1.50)
GFR: 68.65 mL/min (ref >60.00)
Glucose, Bld: 80 mg/dL (ref 70–99)
POTASSIUM: 4.4 meq/L (ref 3.5–5.1)
SODIUM: 139 meq/L (ref 135–145)

## 2016-05-31 LAB — CBC WITH DIFFERENTIAL/PLATELET
BASOS PCT: 0.5 % (ref 0.0–3.0)
Basophils Absolute: 0 10*3/uL (ref 0.0–0.1)
EOS PCT: 3.2 % (ref 0.0–5.0)
Eosinophils Absolute: 0.2 10*3/uL (ref 0.0–0.7)
HEMATOCRIT: 44.9 % (ref 39.0–52.0)
HEMOGLOBIN: 15.6 g/dL (ref 13.0–17.0)
LYMPHS PCT: 19.3 % (ref 12.0–46.0)
Lymphs Abs: 1.4 10*3/uL (ref 0.7–4.0)
MCHC: 34.8 g/dL (ref 30.0–36.0)
MCV: 85.4 fl (ref 78.0–100.0)
MONOS PCT: 9.1 % (ref 3.0–12.0)
Monocytes Absolute: 0.6 10*3/uL (ref 0.1–1.0)
NEUTROS ABS: 4.8 10*3/uL (ref 1.4–7.7)
Neutrophils Relative %: 67.9 % (ref 43.0–77.0)
PLATELETS: 277 10*3/uL (ref 150.0–400.0)
RBC: 5.26 Mil/uL (ref 4.22–5.81)
RDW: 13.8 % (ref 11.5–15.5)
WBC: 7.1 10*3/uL (ref 4.0–10.5)

## 2016-05-31 LAB — LIPID PANEL
CHOL/HDL RATIO: 6
Cholesterol: 205 mg/dL — ABNORMAL HIGH (ref 0–200)
HDL: 31.5 mg/dL — ABNORMAL LOW (ref >39.00)
LDL Cholesterol: 140 mg/dL — ABNORMAL HIGH (ref 0–99)
NONHDL: 173.05
TRIGLYCERIDES: 164 mg/dL — AB (ref 0.0–149.0)
VLDL: 32.8 mg/dL (ref 0.0–40.0)

## 2016-05-31 LAB — HEPATIC FUNCTION PANEL
ALK PHOS: 59 U/L (ref 39–117)
ALT: 81 U/L — ABNORMAL HIGH (ref 0–53)
AST: 36 U/L (ref 0–37)
Albumin: 4.7 g/dL (ref 3.5–5.2)
BILIRUBIN DIRECT: 0.2 mg/dL (ref 0.0–0.3)
BILIRUBIN TOTAL: 0.7 mg/dL (ref 0.2–1.2)
Total Protein: 6.8 g/dL (ref 6.0–8.3)

## 2016-05-31 LAB — POCT URINALYSIS DIPSTICK
GLUCOSE UA: NEGATIVE
Ketones, UA: NEGATIVE
Leukocytes, UA: NEGATIVE
NITRITE UA: NEGATIVE
RBC UA: NEGATIVE
SPEC GRAV UA: 1.02
Urobilinogen, UA: 0.2
pH, UA: 5.5

## 2016-05-31 LAB — TSH: TSH: 0.91 u[IU]/mL (ref 0.35–4.50)

## 2016-05-31 LAB — PSA: PSA: 1.82 ng/mL (ref 0.10–4.00)

## 2016-05-31 MED ORDER — ALLOPURINOL 300 MG PO TABS: ORAL_TABLET | ORAL | 3 refills | Status: DC

## 2016-05-31 MED ORDER — AMLODIPINE BESYLATE 5 MG PO TABS: 5.0000 mg | ORAL_TABLET | Freq: Every day | ORAL | 3 refills | Status: DC

## 2016-05-31 MED ORDER — HYDROCHLOROTHIAZIDE 12.5 MG PO CAPS: 12.5000 mg | ORAL_CAPSULE | Freq: Every day | ORAL | 3 refills | Status: DC

## 2016-05-31 MED ORDER — LISINOPRIL 40 MG PO TABS: 40.0000 mg | ORAL_TABLET | Freq: Two times a day (BID) | ORAL | 3 refills | Status: AC

## 2016-05-31 NOTE — Progress Notes (Signed)
Pre visit review using our clinic review tool, if applicable. No additional management support is needed unless otherwise documented below in the visit note. 

## 2016-05-31 NOTE — Progress Notes (Signed)
Richard Martinez is a 55 year old married male nonsmoker who comes in today for general physical examination clearance for bilateral total knee replacements. He has a history of underlying gout for which he takes allopurinol one tablet daily.  His hypertension was controlled with Norvasc 5 mg, Hydrocort thiazide 12.5 mg, and lisinopril 40 mg daily. BP today 118/82  He takes Prilosec 1 twice a day because of chronic reflux. This was treated by his ENT doctor Dr. Wilburn Cornelia.  He gets routine eye care, dental care, colonoscopy 2015 showed some polyps. He's due to go back in 5 years which will be 2020 for follow-up  He's lost 11 pounds since July 2017. He and his wife finally changed her died of all sugar.  He has degenerative changes in both knees with bone rubbing on bone. It's time for total knee replacement. We referred him to Dr. Clifton James going to do both knees it once which is what I recommend in his situation.  14 point review of systems reviewed and negative except for above  Physical exam  Vital signs stable is afebrile BP 118/82  HEENT were negative neck was supple no adenopathy thyroid normal no carotid bruits cardiopulmonary exam normal abdominal exam normal except for ventral hernia. Genitalia normal circumcised male rectal normal stool guaiac-negative prostate normal extremities normal skin normal peripheral pulses normal except for obvious deformity of both knees secondary to cartilage degeneration  Impression #1 hypertension at goal.......... continue current therapy  #2 history of gout,,,,,,,, continue allopurinol  #3 reflux esophagitis,,,,,,,, continue Prilosec steroid nasal spray and weight loss  #4 obesity,,,,,,, 11 pounds of weight loss since September,,,,,,,,,, continue carbohydrate free diet and exercise program  #5 degenerative joint disease bilateral knees,,,,,,,, bilateral BKr by Dr. Tommy Medal........ cleared for surgery

## 2016-05-31 NOTE — Patient Instructions (Signed)
Continue current medications,,,,,,,,,,, most importantly continue your diet and exercise program  Check your blood pressure daily in the morning  If after surgery we have C see a significant drop in your blood pressure with your weight loss that we can readjust your medications  Return in May for follow-up

## 2016-06-07 ENCOUNTER — Other Ambulatory Visit: Payer: Self-pay | Admitting: Family Medicine

## 2016-06-29 ENCOUNTER — Other Ambulatory Visit: Payer: Self-pay | Admitting: Family Medicine

## 2016-07-01 ENCOUNTER — Encounter (HOSPITAL_COMMUNITY): Payer: Self-pay | Admitting: *Deleted

## 2016-07-22 NOTE — Patient Instructions (Signed)
Richard Martinez  07/22/2016   Your procedure is scheduled on: Monday 08/02/2016  Report to [facility]  Entrance take [facility]  elevators to 3rd floor to  [name] at  0830  AM.  Call this number if you have problems the morning of surgery [phone]   Remember: ONLY 1 PERSON MAY GO WITH YOU TO SHORT STAY TO GET  READY MORNING OF [facility].   Do not eat food or drink liquids :After Midnight.     Take these medicines the morning of surgery with A SIP OF WATER: Amlodipine, Omeprazole                                 You may not have any metal on your body including hair pins and              piercings  Do not wear jewelry, make-up, lotions, powders or perfumes, deodorant             Do not wear nail polish.  Do not shave  48 hours prior to surgery.              Men may shave face and neck.   Do not bring valuables to the [facility]. [facility].  Contacts, dentures or bridgework may not be worn into surgery.  Leave suitcase in the car. After surgery it may be brought to your room.                  Please read over the following fact sheets you were given: _____________________________________________________________________             [facility] - Preparing for Surgery Before surgery, you can play an important role.  Because skin is not sterile, your skin needs to be as free of germs as possible.  You can reduce the number of germs on your skin by washing with CHG (chlorahexidine gluconate) soap before surgery.  CHG is an antiseptic cleaner which kills germs and bonds with the skin to continue killing germs even after washing. Please DO NOT use if you have an allergy to CHG or antibacterial soaps.  If your skin becomes reddened/irritated stop using the CHG and inform your nurse when you arrive at Short Stay. Do not shave (including legs and underarms) for at least 48 hours prior to the first CHG  shower.  You may shave your face/neck. Please follow these instructions carefully:  1.  Shower with CHG Soap the night before surgery and the  morning of Surgery.  2.  If you choose to wash your hair, wash your hair first as usual with your  normal  shampoo.  3.  After you shampoo, rinse your hair and body thoroughly to remove the  shampoo.                           4.  Use CHG as you would any other liquid soap.  You can apply chg directly  to the skin and wash                       Gently with a scrungie or clean washcloth.  5.  Apply the CHG Soap to your body ONLY FROM THE NECK DOWN.   Do not use on face/ open                           Wound or open sores. Avoid contact with eyes, ears mouth and genitals (private parts).                       Wash face,  Genitals (private parts) with your normal soap.             6.  Wash thoroughly, paying special attention to the area where your surgery  will be performed.  7.  Thoroughly rinse your body with warm water from the neck down.  8.  DO NOT shower/wash with your normal soap after using and rinsing off  the CHG Soap.                9.  Pat yourself dry with a clean towel.            10.  Wear clean pajamas.            11.  Place clean sheets on your bed the night of your first shower and do not  sleep with pets. Day of Surgery : Do not apply any lotions/deodorants the morning of surgery.  Please wear clean clothes to the [facility]surgery center.  FAILURE TO FOLLOW THESE INSTRUCTIONS MAY RESULT IN THE CANCELLATION OF YOUR SURGERY PATIENT SIGNATURE_________________________________  NURSE SIGNATURE__________________________________  ________________________________________________________________________   [facility]  An incentive spirometer is a tool that can help keep your lungs clear and active. This tool measures how well you are filling your lungs with each breath. Taking long deep breaths may help reverse or decrease the chance  of developing breathing (pulmonary) problems (especially infection) following:  A long period of time when you are unable to move or be active. BEFORE THE PROCEDURE   If the spirometer includes an indicator to show your best effort, your nurse or respiratory therapist will set it to a desired goal.  If possible, sit up straight or lean slightly forward. Try not to slouch.  Hold the incentive spirometer in an upright position. INSTRUCTIONS FOR USE  1. Sit on the edge of your bed if possible, or sit up as far as you can in bed or on a chair. 2. Hold the incentive spirometer in an upright position. 3. Breathe out normally. 4. Place the mouthpiece in your mouth and seal your lips tightly around it. 5. Breathe in slowly and as deeply as possible, raising the piston or the ball toward the top of the column. 6. Hold your breath for 3-5 seconds or for as long as possible. Allow the piston or ball to fall to the bottom of the column. 7. Remove the mouthpiece from your mouth and breathe out normally. 8. Rest for a few seconds and repeat Steps 1 through 7 at least 10 times every 1-2 hours when you are awake. Take your time and take a few normal breaths between deep breaths. 9. The spirometer may include an indicator to show your best effort. Use the indicator as a goal to work toward during each repetition. 10. After each set of 10 deep breaths, practice coughing to be sure your lungs are clear. If you have an incision (the cut made at the time of surgery), support your incision when coughing by placing a  pillow or rolled up towels firmly against it. Once you are able to get out of bed, walk around indoors and cough well. You may stop using the incentive spirometer when instructed by your caregiver.  RISKS AND COMPLICATIONS  Take your time so you do not get dizzy or light-headed.  If you are in pain, you may need to take or ask for pain medication before doing incentive spirometry. It is harder to  take a deep breath if you are having pain. AFTER USE  Rest and breathe slowly and easily.  It can be helpful to keep track of a log of your progress. Your caregiver can provide you with a simple table to help with this. If you are using the spirometer at home, follow these instructions: [place] IF:   You are having difficultly using the spirometer.  You have trouble using the spirometer as often as instructed.  Your pain medication is not giving enough relief while using the spirometer.  You develop fever of 100.5 F (38.1 C) or higher. SEEK IMMEDIATE MEDICAL CARE IF:   You cough up bloody sputum that had not been present before.  You develop fever of 102 F (38.9 C) or greater.  You develop worsening pain at or near the incision site. MAKE SURE YOU:   Understand these instructions.  Will watch your condition.  Will get help right away if you are not doing well or get worse. Document Released: 12/13/2006 Document Revised: 10/25/2011 Document Reviewed: 02/13/2007 Exit[employer] Patient Information 2014 Exit[employer], [place].   ________________________________________________________________________  WHAT IS A BLOOD TRANSFUSION? Blood Transfusion Information  A transfusion is the replacement of blood or some of its parts. Blood is made up of multiple cells which provide different functions.  Red blood cells carry oxygen and are used for blood loss replacement.  White blood cells fight against infection.  Platelets control bleeding.  Plasma helps clot blood.  Other blood products are available for specialized needs, such as hemophilia or other clotting disorders. BEFORE THE TRANSFUSION  Who gives blood for transfusions?   Healthy volunteers who are fully evaluated to make sure their blood is safe. This is blood bank blood. Transfusion therapy is the safest it has ever been in the practice of medicine. Before blood is taken from a donor, a complete history is taken to  make sure that person has no history of diseases nor engages in risky social behavior (examples are intravenous drug use or sexual activity with multiple partners). The donor's travel history is screened to minimize risk of transmitting infections, such as malaria. The donated blood is tested for signs of infectious diseases, such as HIV and hepatitis. The blood is then tested to be sure it is compatible with you in order to minimize the chance of a transfusion reaction. If you or a relative donates blood, this is often done in anticipation of surgery and is not appropriate for emergency situations. It takes many days to process the donated blood. RISKS AND COMPLICATIONS Although transfusion therapy is very safe and saves many lives, the main dangers of transfusion include:   Getting an infectious disease.  Developing a transfusion reaction. This is an allergic reaction to something in the blood you were given. Every precaution is taken to prevent this. The decision to have a blood transfusion has been considered carefully by your caregiver before blood is given. Blood is not given unless the benefits outweigh the risks. AFTER THE TRANSFUSION  Right after receiving a blood transfusion, you  will usually feel much better and more energetic. This is especially true if your red blood cells have gotten low (anemic). The transfusion raises the level of the red blood cells which carry oxygen, and this usually causes an energy increase.  The nurse administering the transfusion will monitor you carefully for complications. HOME CARE INSTRUCTIONS  No special instructions are needed after a transfusion. You may find your energy is better. Speak with your caregiver about any limitations on activity for underlying diseases you may have. SEEK MEDICAL CARE IF:   Your condition is not improving after your transfusion.  You develop redness or irritation at the intravenous (IV) site. SEEK IMMEDIATE MEDICAL CARE  IF:  Any of the following symptoms occur over the next 12 hours:  Shaking chills.  You have a temperature by mouth above 102 F (38.9 C), not controlled by medicine.  Chest, back, or muscle pain.  People around you feel you are not acting correctly or are confused.  Shortness of breath or difficulty breathing.  Dizziness and fainting.  You get a rash or develop hives.  You have a decrease in urine output.  Your urine turns a dark color or changes to pink, red, or brown. Any of the following symptoms occur over the next 10 days:  You have a temperature by mouth above 102 F (38.9 C), not controlled by medicine.  Shortness of breath.  Weakness after normal activity.  The white part of the eye turns yellow (jaundice).  You have a decrease in the amount of urine or are urinating less often.  Your urine turns a dark color or changes to pink, red, or brown. Document Released: 07/30/2000 Document Revised: 10/25/2011 Document Reviewed: 03/18/2008 [facility] Patient Information 2014 [place], [place].  _______________________________________________________________________

## 2016-07-22 NOTE — H&P (Signed)
TOTAL KNEE ADMISSION H&P  Patient is being admitted for bilateral total knee arthroplasty.  Subjective:  Chief Complaint:   Bilateral knee primary OA / pain  HPI: [facility], 55 y.o. male, has a history of pain and functional disability in the bilateral knee due to arthritis and has failed non-surgical conservative treatments for greater than 12 weeks to include NSAID's and/or analgesics, corticosteriod injections, use of assistive devices and activity modification.  Onset of symptoms was gradual, starting >10 years ago with gradually worsening course since that time. The patient noted prior procedures on the knee to include  arthroscopy on the bilateral knees.  Patient currently rates pain in the bilateral knees at 7 out of 10 with activity. Patient has worsening of pain with activity and weight bearing, pain that interferes with activities of daily living, pain with passive range of motion, crepitus and joint swelling.  Patient has evidence of periarticular osteophytes and joint space narrowing by imaging studies. There is no active infection.   Risks, benefits and expectations were discussed with the patient.  Risks including but not limited to the risk of anesthesia, blood clots, nerve damage, blood vessel damage, failure of the prosthesis, infection and up to and including death.  Patient understand the risks, benefits and expectations and wishes to proceed with surgery.   PCP: Joycelyn Man, MD  D/C Plans:      Home  Post-op Meds:       No Rx given  Tranexamic Acid:      To be given - IV   Decadron:      Is to be given  FYI:     Xarelto then ASA  Oxycodone    Patient Active Problem List   Diagnosis Date Noted  . Degenerative joint disease 05/31/2016  . 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 Gout 06/25/2008  . Hyperlipidemia 04/13/2007  . Obesity 04/13/2007  . Essential hypertension 04/13/2007  . GERD 04/13/2007   Past Medical  History:  Diagnosis Date  . GERD (gastroesophageal reflux disease)   . Gout   . Hyperlipidemia   . Hypertension   . Obesity     Past Surgical History:  Procedure Laterality Date  . KNEE ARTHROSCOPY Bilateral   . RHINOPLASTY  1989    No prescriptions prior to admission.   No Known Allergies   Social History  Substance Use Topics  . Smoking status: Never Smoker  . Smokeless tobacco: Never Used  . Alcohol use Yes     Comment: 3 per week    Family History  Problem Relation Age of Onset  . Colon cancer Paternal Grandfather 31  . Diabetes      fhx  . Hypertension      fhx  . Esophageal cancer Neg Hx   . Stomach cancer Neg Hx   . Rectal cancer Neg Hx      Review of Systems  Constitutional: Negative.   HENT: Negative.   Eyes: Negative.   Respiratory: Negative.   Cardiovascular: Negative.   Gastrointestinal: Positive for heartburn.  Genitourinary: Negative.   Musculoskeletal: Positive for joint pain.  Skin: Negative.   Neurological: Negative.   Endo/Heme/Allergies: Negative.   Psychiatric/Behavioral: Negative.     Objective:  Physical Exam  Constitutional: He is oriented to person, place, and time. He appears well-developed.  HENT:  Head: Normocephalic.  Eyes: Pupils are equal, round, and reactive to light.  Neck: Neck supple. No JVD present. No tracheal deviation present.  No thyromegaly present.  Cardiovascular: Normal rate, regular rhythm, normal heart sounds and intact distal pulses.   Respiratory: Effort normal and breath sounds normal. No respiratory distress. He has no wheezes.  GI: Soft. There is no tenderness. There is no guarding.  Musculoskeletal:       Right knee: He exhibits decreased range of motion, swelling and bony tenderness. He exhibits no ecchymosis, no deformity, no laceration and no erythema. Tenderness found.       Left knee: He exhibits decreased range of motion, swelling and bony tenderness. He exhibits no ecchymosis, no deformity, no  laceration and no erythema. Tenderness found.  Lymphadenopathy:    He has no cervical adenopathy.  Neurological: He is alert and oriented to person, place, and time.  Skin: Skin is warm and dry.  Psychiatric: He has a normal mood and affect.     Labs:  Estimated body mass index is 40.14 kg/m as calculated from the following:   Height as of 03/01/16: 6' (1.829 m).   Weight as of 05/31/16: 134.3 kg (296 lb).   Imaging Review Plain radiographs demonstrate severe degenerative joint disease of the bilateral knees.  The bone quality appears to be good for age and reported activity level.  Assessment/Plan:  End stage arthritis, bilateral knees  The patient history, physical examination, clinical judgment of the provider and imaging studies are consistent with end stage degenerative joint disease of the bilateral knees and total knee arthroplasties is deemed medically necessary. The treatment options including medical management, injection therapy arthroscopy and arthroplasty were discussed at length. The risks and benefits of total knee arthroplasty were presented and reviewed. The risks due to aseptic loosening, infection, stiffness, patella tracking problems, thromboembolic complications and other imponderables were discussed. The patient acknowledged the explanation, agreed to proceed with the plan and consent was signed. Patient is being admitted for inpatient treatment for surgery, pain control, PT, OT, prophylactic antibiotics, VTE prophylaxis, progressive ambulation and ADL's and discharge planning. The patient is planning to be discharged home.      West Pugh Clemmie Buelna   PA-C  07/22/2016, 12:02 PM

## 2016-07-26 ENCOUNTER — Encounter (HOSPITAL_COMMUNITY): Payer: Self-pay

## 2016-07-26 ENCOUNTER — Encounter (HOSPITAL_COMMUNITY)
Admission: RE | Admit: 2016-07-26 | Discharge: 2016-07-26 | Disposition: A | Discharge status: Home / Self Care | Payer: BLUE CROSS/BLUE SHIELD | Source: Ambulatory Visit | Attending: Orthopedic Surgery | Admitting: Orthopedic Surgery

## 2016-07-26 DIAGNOSIS — M1990 Unspecified osteoarthritis, unspecified site: Secondary | ICD-10-CM | POA: Insufficient documentation

## 2016-07-26 DIAGNOSIS — E785 Hyperlipidemia, unspecified: Secondary | ICD-10-CM | POA: Diagnosis not present

## 2016-07-26 DIAGNOSIS — M109 Gout, unspecified: Secondary | ICD-10-CM | POA: Diagnosis not present

## 2016-07-26 DIAGNOSIS — I10 Essential (primary) hypertension: Secondary | ICD-10-CM | POA: Diagnosis not present

## 2016-07-26 DIAGNOSIS — Z01812 Encounter for preprocedural laboratory examination: Secondary | ICD-10-CM | POA: Insufficient documentation

## 2016-07-26 DIAGNOSIS — E669 Obesity, unspecified: Secondary | ICD-10-CM | POA: Diagnosis not present

## 2016-07-26 HISTORY — DX: Unspecified osteoarthritis, unspecified site: M19.90

## 2016-07-26 LAB — BASIC METABOLIC PANEL
Anion gap: 7 (ref 5–15)
BUN: 27 mg/dL — AB (ref 6–20)
CALCIUM: 9.4 mg/dL (ref 8.9–10.3)
CO2: 25 mmol/L (ref 22–32)
CREATININE: 1.24 mg/dL (ref 0.61–1.24)
Chloride: 106 mmol/L (ref 101–111)
GFR calc Af Amer: >60 mL/min (ref >60)
Glucose, Bld: 93 mg/dL (ref 65–99)
POTASSIUM: 4 mmol/L (ref 3.5–5.1)
SODIUM: 138 mmol/L (ref 135–145)

## 2016-07-26 LAB — SURGICAL PCR SCREEN
MRSA, PCR: NEGATIVE
STAPHYLOCOCCUS AUREUS: NEGATIVE

## 2016-07-26 LAB — CBC
HEMATOCRIT: 45.7 % (ref 39.0–52.0)
Hemoglobin: 15.5 g/dL (ref 13.0–17.0)
MCH: 29.9 pg (ref 26.0–34.0)
MCHC: 33.9 g/dL (ref 30.0–36.0)
MCV: 88.2 fL (ref 78.0–100.0)
PLATELETS: 272 10*3/uL (ref 150–400)
RBC: 5.18 MIL/uL (ref 4.22–5.81)
RDW: 13.4 % (ref 11.5–15.5)
WBC: 9.9 10*3/uL (ref 4.0–10.5)

## 2016-07-26 LAB — ABO/RH: ABO/RH(D): O POS

## 2016-07-26 NOTE — Progress Notes (Signed)
   07/26/16 1453  OBSTRUCTIVE SLEEP APNEA  Have you ever been diagnosed with sleep apnea through a sleep study? No  Do you snore loudly (loud enough to be heard through closed doors)?  0  Do you often feel tired, fatigued, or sleepy during the daytime (such as falling asleep during driving or talking to someone)? 1  Has anyone observed you stop breathing during your sleep? 0  Do you have, or are you being treated for high blood pressure? 1  BMI more than 35 kg/m2? 1  Age > 50 (1-yes) 1  Neck circumference greater than:Male 16 inches or larger, Male 17inches or larger? 1  Male Gender (Yes=1) 1  Obstructive Sleep Apnea Score 6  Score 5 or greater  Results sent to PCP

## 2016-08-02 ENCOUNTER — Inpatient Hospital Stay (HOSPITAL_COMMUNITY)
Admission: RE | Admit: 2016-08-02 | Discharge: 2016-08-04 | DRG: 462 | Disposition: A | Discharge status: Home / Self Care | Payer: BLUE CROSS/BLUE SHIELD | Source: Ambulatory Visit | Attending: Orthopedic Surgery | Admitting: Orthopedic Surgery

## 2016-08-02 ENCOUNTER — Encounter (HOSPITAL_COMMUNITY)
Admission: RE | Disposition: A | Discharge status: Home / Self Care | Payer: Self-pay | Source: Ambulatory Visit | Attending: Orthopedic Surgery

## 2016-08-02 ENCOUNTER — Inpatient Hospital Stay (HOSPITAL_COMMUNITY): Payer: BLUE CROSS/BLUE SHIELD | Admitting: Anesthesiology

## 2016-08-02 ENCOUNTER — Encounter (HOSPITAL_COMMUNITY): Payer: Self-pay | Admitting: *Deleted

## 2016-08-02 DIAGNOSIS — I10 Essential (primary) hypertension: Secondary | ICD-10-CM | POA: Diagnosis present

## 2016-08-02 DIAGNOSIS — Z6841 Body Mass Index (BMI) 40.0 and over, adult: Secondary | ICD-10-CM

## 2016-08-02 DIAGNOSIS — K219 Gastro-esophageal reflux disease without esophagitis: Secondary | ICD-10-CM | POA: Diagnosis present

## 2016-08-02 DIAGNOSIS — M109 Gout, unspecified: Secondary | ICD-10-CM | POA: Diagnosis present

## 2016-08-02 DIAGNOSIS — M170 Bilateral primary osteoarthritis of knee: Principal | ICD-10-CM | POA: Diagnosis present

## 2016-08-02 DIAGNOSIS — Z96659 Presence of unspecified artificial knee joint: Secondary | ICD-10-CM

## 2016-08-02 DIAGNOSIS — M659 Synovitis and tenosynovitis, unspecified: Secondary | ICD-10-CM | POA: Diagnosis present

## 2016-08-02 HISTORY — PX: TOTAL KNEE ARTHROPLASTY: SHX125

## 2016-08-02 LAB — TYPE AND SCREEN
ABO/RH(D): O POS
ANTIBODY SCREEN: NEGATIVE

## 2016-08-02 SURGERY — ARTHROPLASTY, KNEE, BILATERAL, TOTAL
Anesthesia: Spinal | Site: Knee | Laterality: Bilateral

## 2016-08-02 MED ORDER — KETOROLAC TROMETHAMINE 30 MG/ML IJ SOLN
INTRAMUSCULAR | Status: AC
  Filled 2016-08-02: qty 1

## 2016-08-02 MED ORDER — STERILE WATER FOR IRRIGATION IR SOLN
Status: DC | PRN
  Administered 2016-08-02: 3000 mL

## 2016-08-02 MED ORDER — RIVAROXABAN 10 MG PO TABS
10.0000 mg | ORAL_TABLET | ORAL | Status: DC
  Administered 2016-08-03 – 2016-08-04 (×2): 10 mg via ORAL
  Filled 2016-08-02 (×2): qty 1

## 2016-08-02 MED ORDER — MIDAZOLAM HCL 5 MG/5ML IJ SOLN
INTRAMUSCULAR | Status: DC | PRN
  Administered 2016-08-02: 2 mg via INTRAVENOUS

## 2016-08-02 MED ORDER — BUPIVACAINE HCL (PF) 0.25 % IJ SOLN
INTRAMUSCULAR | Status: DC | PRN
  Administered 2016-08-02 (×2): 15 mL

## 2016-08-02 MED ORDER — LIDOCAINE 2% (20 MG/ML) 5 ML SYRINGE
INTRAMUSCULAR | Status: DC | PRN
  Administered 2016-08-02: 60 mg via INTRAVENOUS

## 2016-08-02 MED ORDER — FENTANYL CITRATE (PF) 100 MCG/2ML IJ SOLN
INTRAMUSCULAR | Status: AC
  Filled 2016-08-02: qty 2

## 2016-08-02 MED ORDER — MENTHOL 3 MG MT LOZG: 1.0000 | LOZENGE | OROMUCOSAL | Status: DC | PRN

## 2016-08-02 MED ORDER — DEXTROSE 5 % IV SOLN
3.0000 g | INTRAVENOUS | Status: AC
  Administered 2016-08-02: 3 g via INTRAVENOUS
  Filled 2016-08-02: qty 3

## 2016-08-02 MED ORDER — LIDOCAINE 2% (20 MG/ML) 5 ML SYRINGE
INTRAMUSCULAR | Status: AC
  Filled 2016-08-02: qty 5

## 2016-08-02 MED ORDER — SODIUM CHLORIDE 0.9 % IV SOLN
INTRAVENOUS | Status: DC
  Administered 2016-08-02 – 2016-08-03 (×2): via INTRAVENOUS
  Filled 2016-08-02 (×7): qty 1000

## 2016-08-02 MED ORDER — ALLOPURINOL 300 MG PO TABS
300.0000 mg | ORAL_TABLET | Freq: Every day | ORAL | Status: DC
  Administered 2016-08-02 – 2016-08-04 (×3): 300 mg via ORAL
  Filled 2016-08-02 (×3): qty 1

## 2016-08-02 MED ORDER — ONDANSETRON HCL 4 MG/2ML IJ SOLN: 4.0000 mg | Freq: Four times a day (QID) | INTRAMUSCULAR | Status: DC | PRN

## 2016-08-02 MED ORDER — FLUTICASONE PROPIONATE 50 MCG/ACT NA SUSP
2.0000 | Freq: Every day | NASAL | Status: DC
  Administered 2016-08-02 – 2016-08-03 (×2): 2 via NASAL
  Filled 2016-08-02: qty 16

## 2016-08-02 MED ORDER — KETOROLAC TROMETHAMINE 30 MG/ML IJ SOLN
INTRAMUSCULAR | Status: DC | PRN
  Administered 2016-08-02 (×2): 15 mg

## 2016-08-02 MED ORDER — PROPOFOL 10 MG/ML IV BOLUS
INTRAVENOUS | Status: AC
  Filled 2016-08-02: qty 40

## 2016-08-02 MED ORDER — POLYETHYLENE GLYCOL 3350 17 G PO PACK
17.0000 g | PACK | Freq: Two times a day (BID) | ORAL | Status: DC
  Administered 2016-08-02 – 2016-08-04 (×4): 17 g via ORAL
  Filled 2016-08-02 (×4): qty 1

## 2016-08-02 MED ORDER — AMLODIPINE BESYLATE 5 MG PO TABS
5.0000 mg | ORAL_TABLET | Freq: Every day | ORAL | Status: DC
  Administered 2016-08-03: 08:00:00 5 mg via ORAL
  Filled 2016-08-02 (×2): qty 1

## 2016-08-02 MED ORDER — PROPOFOL 10 MG/ML IV BOLUS
INTRAVENOUS | Status: AC
  Filled 2016-08-02: qty 60

## 2016-08-02 MED ORDER — HYDROCODONE-ACETAMINOPHEN 7.5-325 MG PO TABS
1.0000 | ORAL_TABLET | ORAL | Status: DC
  Administered 2016-08-02 – 2016-08-04 (×12): 2 via ORAL
  Filled 2016-08-02 (×12): qty 2

## 2016-08-02 MED ORDER — DEXAMETHASONE SODIUM PHOSPHATE 10 MG/ML IJ SOLN
10.0000 mg | Freq: Once | INTRAMUSCULAR | Status: AC
  Administered 2016-08-03: 08:00:00 10 mg via INTRAVENOUS
  Filled 2016-08-02: qty 1

## 2016-08-02 MED ORDER — HYDROMORPHONE HCL 2 MG/ML IJ SOLN
0.5000 mg | INTRAMUSCULAR | Status: DC | PRN
  Administered 2016-08-02: 2 mg via INTRAVENOUS
  Administered 2016-08-02 – 2016-08-03 (×3): 1 mg via INTRAVENOUS
  Administered 2016-08-03: 10:00:00 2 mg via INTRAVENOUS
  Filled 2016-08-02 (×5): qty 1

## 2016-08-02 MED ORDER — PHENOL 1.4 % MT LIQD: 1.0000 | OROMUCOSAL | Status: DC | PRN

## 2016-08-02 MED ORDER — HYDROMORPHONE HCL 2 MG/ML IJ SOLN
INTRAMUSCULAR | Status: AC
  Filled 2016-08-02: qty 1

## 2016-08-02 MED ORDER — ONDANSETRON HCL 4 MG/2ML IJ SOLN
INTRAMUSCULAR | Status: AC
  Filled 2016-08-02: qty 2

## 2016-08-02 MED ORDER — BISACODYL 10 MG RE SUPP: 10.0000 mg | Freq: Every day | RECTAL | Status: DC | PRN

## 2016-08-02 MED ORDER — DIPHENHYDRAMINE HCL 25 MG PO CAPS: 25.0000 mg | ORAL_CAPSULE | Freq: Four times a day (QID) | ORAL | Status: DC | PRN

## 2016-08-02 MED ORDER — MAGNESIUM CITRATE PO SOLN
1.0000 | Freq: Once | ORAL | Status: DC | PRN
Start: 2016-08-02 — End: 2016-08-04

## 2016-08-02 MED ORDER — BUPIVACAINE IN DEXTROSE 0.75-8.25 % IT SOLN
INTRATHECAL | Status: DC | PRN
  Administered 2016-08-02: 2 mL via INTRATHECAL

## 2016-08-02 MED ORDER — TRANEXAMIC ACID 1000 MG/10ML IV SOLN
1000.0000 mg | INTRAVENOUS | Status: AC
  Administered 2016-08-02: 1000 mg via INTRAVENOUS
  Filled 2016-08-02: qty 1100

## 2016-08-02 MED ORDER — FERROUS SULFATE 325 (65 FE) MG PO TABS
325.0000 mg | ORAL_TABLET | Freq: Three times a day (TID) | ORAL | Status: DC
  Administered 2016-08-03 – 2016-08-04 (×5): 325 mg via ORAL
  Filled 2016-08-02 (×5): qty 1

## 2016-08-02 MED ORDER — CEFAZOLIN SODIUM-DEXTROSE 2-4 GM/100ML-% IV SOLN
2.0000 g | Freq: Four times a day (QID) | INTRAVENOUS | Status: AC
  Administered 2016-08-02 (×2): 2 g via INTRAVENOUS
  Filled 2016-08-02 (×2): qty 100

## 2016-08-02 MED ORDER — METHOCARBAMOL 1000 MG/10ML IJ SOLN
500.0000 mg | Freq: Four times a day (QID) | INTRAVENOUS | Status: DC | PRN
  Administered 2016-08-02: 500 mg via INTRAVENOUS
  Filled 2016-08-02: qty 550
  Filled 2016-08-02: qty 5

## 2016-08-02 MED ORDER — METOCLOPRAMIDE HCL 5 MG PO TABS: 5.0000 mg | ORAL_TABLET | Freq: Three times a day (TID) | ORAL | Status: DC | PRN

## 2016-08-02 MED ORDER — DOCUSATE SODIUM 100 MG PO CAPS
100.0000 mg | ORAL_CAPSULE | Freq: Two times a day (BID) | ORAL | Status: DC
  Administered 2016-08-02 – 2016-08-04 (×4): 100 mg via ORAL
  Filled 2016-08-02 (×4): qty 1

## 2016-08-02 MED ORDER — SODIUM CHLORIDE 0.9 % IR SOLN
Status: DC | PRN
  Administered 2016-08-02: 1000 mL

## 2016-08-02 MED ORDER — SODIUM CHLORIDE 0.9 % IJ SOLN
INTRAMUSCULAR | Status: AC
  Filled 2016-08-02: qty 50

## 2016-08-02 MED ORDER — METHOCARBAMOL 500 MG PO TABS
500.0000 mg | ORAL_TABLET | Freq: Four times a day (QID) | ORAL | Status: DC | PRN
  Administered 2016-08-03 – 2016-08-04 (×5): 500 mg via ORAL
  Filled 2016-08-02 (×5): qty 1

## 2016-08-02 MED ORDER — CHLORHEXIDINE GLUCONATE 4 % EX LIQD: 60.0000 mL | Freq: Once | CUTANEOUS | Status: DC

## 2016-08-02 MED ORDER — LACTATED RINGERS IV SOLN
INTRAVENOUS | Status: DC
  Administered 2016-08-02: 13:00:00 via INTRAVENOUS
  Administered 2016-08-02: 1000 mL via INTRAVENOUS
  Administered 2016-08-02: 11:00:00 via INTRAVENOUS

## 2016-08-02 MED ORDER — OMEPRAZOLE 20 MG PO CPDR
40.0000 mg | DELAYED_RELEASE_CAPSULE | Freq: Two times a day (BID) | ORAL | Status: DC
  Administered 2016-08-02 – 2016-08-04 (×4): 40 mg via ORAL
  Filled 2016-08-02 (×8): qty 2

## 2016-08-02 MED ORDER — PROPOFOL 10 MG/ML IV BOLUS
INTRAVENOUS | Status: AC
  Filled 2016-08-02: qty 20

## 2016-08-02 MED ORDER — SODIUM CHLORIDE 0.9 % IJ SOLN
INTRAMUSCULAR | Status: DC | PRN
  Administered 2016-08-02 (×2): 25 mL

## 2016-08-02 MED ORDER — DEXAMETHASONE SODIUM PHOSPHATE 10 MG/ML IJ SOLN
10.0000 mg | Freq: Once | INTRAMUSCULAR | Status: AC
  Administered 2016-08-02: 10 mg via INTRAVENOUS

## 2016-08-02 MED ORDER — FENTANYL CITRATE (PF) 100 MCG/2ML IJ SOLN
INTRAMUSCULAR | Status: DC | PRN
  Administered 2016-08-02: 50 ug via INTRAVENOUS
  Administered 2016-08-02: 25 ug via INTRAVENOUS
  Administered 2016-08-02 (×2): 50 ug via INTRAVENOUS
  Administered 2016-08-02: 25 ug via INTRAVENOUS

## 2016-08-02 MED ORDER — ONDANSETRON HCL 4 MG PO TABS: 4.0000 mg | ORAL_TABLET | Freq: Four times a day (QID) | ORAL | Status: DC | PRN

## 2016-08-02 MED ORDER — ALUM & MAG HYDROXIDE-SIMETH 200-200-20 MG/5ML PO SUSP: 30.0000 mL | ORAL | Status: DC | PRN

## 2016-08-02 MED ORDER — PROPOFOL 500 MG/50ML IV EMUL
INTRAVENOUS | Status: DC | PRN
  Administered 2016-08-02: 100 ug/kg/min via INTRAVENOUS

## 2016-08-02 MED ORDER — METOCLOPRAMIDE HCL 5 MG/ML IJ SOLN: 5.0000 mg | Freq: Three times a day (TID) | INTRAMUSCULAR | Status: DC | PRN

## 2016-08-02 MED ORDER — ONDANSETRON HCL 4 MG/2ML IJ SOLN
INTRAMUSCULAR | Status: DC | PRN
  Administered 2016-08-02: 4 mg via INTRAVENOUS

## 2016-08-02 MED ORDER — MIDAZOLAM HCL 2 MG/2ML IJ SOLN
INTRAMUSCULAR | Status: AC
  Filled 2016-08-02: qty 2

## 2016-08-02 MED ORDER — PROMETHAZINE HCL 25 MG/ML IJ SOLN: 6.2500 mg | INTRAMUSCULAR | Status: DC | PRN

## 2016-08-02 MED ORDER — DEXAMETHASONE SODIUM PHOSPHATE 10 MG/ML IJ SOLN
INTRAMUSCULAR | Status: AC
  Filled 2016-08-02: qty 1

## 2016-08-02 MED ORDER — CELECOXIB 200 MG PO CAPS
200.0000 mg | ORAL_CAPSULE | Freq: Two times a day (BID) | ORAL | Status: DC
  Administered 2016-08-02 – 2016-08-04 (×4): 200 mg via ORAL
  Filled 2016-08-02 (×4): qty 1

## 2016-08-02 MED ORDER — BUPIVACAINE HCL (PF) 0.25 % IJ SOLN
INTRAMUSCULAR | Status: AC
  Filled 2016-08-02: qty 30

## 2016-08-02 MED ORDER — HYDROCHLOROTHIAZIDE 12.5 MG PO CAPS
12.5000 mg | ORAL_CAPSULE | Freq: Every day | ORAL | Status: DC
  Administered 2016-08-02 – 2016-08-03 (×2): 12.5 mg via ORAL
  Filled 2016-08-02 (×3): qty 1

## 2016-08-02 MED ORDER — HYDROMORPHONE HCL 2 MG/ML IJ SOLN
0.2000 mg | INTRAMUSCULAR | Status: DC | PRN
  Administered 2016-08-02 (×4): 0.5 mg via INTRAVENOUS

## 2016-08-02 SURGICAL SUPPLY — 60 items
ADH SKN CLS APL DERMABOND .7 (GAUZE/BANDAGES/DRESSINGS) ×2
BAG SPEC THK2 15X12 ZIP CLS (MISCELLANEOUS)
BAG ZIPLOCK 12X15 (MISCELLANEOUS) IMPLANT
BANDAGE ACE 6X5 VEL STRL LF (GAUZE/BANDAGES/DRESSINGS) ×6 IMPLANT
BANDAGE ESMARK 6X9 LF (GAUZE/BANDAGES/DRESSINGS) ×2 IMPLANT
BLADE SAW SGTL 13.0X1.19X90.0M (BLADE) ×6 IMPLANT
BLADE SURG SZ10 CARB STEEL (BLADE) ×3 IMPLANT
BNDG CMPR 9X6 STRL LF SNTH (GAUZE/BANDAGES/DRESSINGS) ×2
BNDG COHESIVE 4X5 TAN STRL (GAUZE/BANDAGES/DRESSINGS) ×3 IMPLANT
BNDG ESMARK 6X9 LF (GAUZE/BANDAGES/DRESSINGS) ×6
BOWL SMART MIX CTS (DISPOSABLE) ×6 IMPLANT
CAPT KNEE TOTAL 3 ATTUNE ×4 IMPLANT
CEMENT HV SMART SET (Cement) ×8 IMPLANT
CLOTH BEACON ORANGE TIMEOUT ST (SAFETY) ×3 IMPLANT
CUFF TOURN SGL QUICK 34 (TOURNIQUET CUFF) ×6
CUFF TRNQT CYL 34X4X40X1 (TOURNIQUET CUFF) ×2 IMPLANT
DECANTER SPIKE VIAL GLASS SM (MISCELLANEOUS) ×3 IMPLANT
DERMABOND ADVANCED (GAUZE/BANDAGES/DRESSINGS) ×4
DERMABOND ADVANCED .7 DNX12 (GAUZE/BANDAGES/DRESSINGS) ×2 IMPLANT
DRAPE EXTREMITY BILATERAL (DRAPES) ×3 IMPLANT
DRAPE INCISE IOBAN 66X45 STRL (DRAPES) ×3 IMPLANT
DRAPE U-SHAPE 47X51 STRL (DRAPES) ×9 IMPLANT
DRESSING AQUACEL AG SP 3.5X10 (GAUZE/BANDAGES/DRESSINGS) ×2 IMPLANT
DRSG AQUACEL AG ADV 3.5X10 (GAUZE/BANDAGES/DRESSINGS) ×4 IMPLANT
DRSG AQUACEL AG SP 3.5X10 (GAUZE/BANDAGES/DRESSINGS) ×6
DURAPREP 26ML APPLICATOR (WOUND CARE) ×6 IMPLANT
ELECT REM PT RETURN 9FT ADLT (ELECTROSURGICAL) ×3
ELECTRODE REM PT RTRN 9FT ADLT (ELECTROSURGICAL) ×1 IMPLANT
FACESHIELD WRAPAROUND (MASK) ×15 IMPLANT
FACESHIELD WRAPAROUND OR TEAM (MASK) ×3 IMPLANT
GLOVE BIOGEL M 7.0 STRL (GLOVE) ×4 IMPLANT
GLOVE BIOGEL PI IND STRL 7.5 (GLOVE) ×1 IMPLANT
GLOVE BIOGEL PI IND STRL 8.5 (GLOVE) ×1 IMPLANT
GLOVE BIOGEL PI INDICATOR 7.5 (GLOVE) ×18
GLOVE BIOGEL PI INDICATOR 8.5 (GLOVE) ×2
GLOVE ECLIPSE 8.0 STRL XLNG CF (GLOVE) ×6 IMPLANT
GLOVE ORTHO TXT STRL SZ7.5 (GLOVE) ×6 IMPLANT
GOWN STRL REUS W/TWL LRG LVL3 (GOWN DISPOSABLE) ×7 IMPLANT
GOWN STRL REUS W/TWL XL LVL3 (GOWN DISPOSABLE) ×5 IMPLANT
HANDPIECE INTERPULSE COAX TIP (DISPOSABLE) ×3
MANIFOLD NEPTUNE II (INSTRUMENTS) ×3 IMPLANT
NDL SAFETY ECLIPSE 18X1.5 (NEEDLE) ×2 IMPLANT
NEEDLE HYPO 18GX1.5 SHARP (NEEDLE) ×6
NS IRRIG 1000ML POUR BTL (IV SOLUTION) ×3 IMPLANT
PACK TOTAL KNEE CUSTOM (KITS) ×3 IMPLANT
SET HNDPC FAN SPRY TIP SCT (DISPOSABLE) ×1 IMPLANT
SET PAD KNEE POSITIONER (MISCELLANEOUS) ×6 IMPLANT
SPONGE LAP 18X18 X RAY DECT (DISPOSABLE) ×8 IMPLANT
STOCKINETTE 8 INCH (MISCELLANEOUS) ×3 IMPLANT
SUT MNCRL AB 4-0 PS2 18 (SUTURE) ×6 IMPLANT
SUT VIC AB 1 CT1 36 (SUTURE) ×10 IMPLANT
SUT VIC AB 2-0 CT1 27 (SUTURE) ×12
SUT VIC AB 2-0 CT1 TAPERPNT 27 (SUTURE) ×4 IMPLANT
SUT VLOC 180 0 24IN GS25 (SUTURE) ×6 IMPLANT
SYRINGE 60CC LL (MISCELLANEOUS) ×3 IMPLANT
TOWEL OR 17X26 10 PK STRL BLUE (TOWEL DISPOSABLE) ×3 IMPLANT
TRAY FOLEY W/METER SILVER 16FR (SET/KITS/TRAYS/PACK) ×3 IMPLANT
WATER STERILE IRR 1500ML POUR (IV SOLUTION) ×3 IMPLANT
WRAP KNEE MAXI GEL POST OP (GAUZE/BANDAGES/DRESSINGS) ×6 IMPLANT
YANKAUER SUCT BULB TIP 10FT TU (MISCELLANEOUS) IMPLANT

## 2016-08-02 NOTE — Anesthesia Procedure Notes (Signed)
Spinal  Patient location during procedure: OR Start time: 08/02/2016 10:02 AM End time: 08/02/2016 10:10 AM Staffing Anesthesiologist: Tarri Guilfoil, Iona Beard Performed: anesthesiologist  Preanesthetic Checklist Completed: patient identified, site marked, surgical consent, pre-op evaluation, timeout performed, IV checked, risks and benefits discussed and monitors and equipment checked Spinal Block Patient position: sitting Prep: Betadine Patient monitoring: heart rate, continuous pulse ox and blood pressure Injection technique: single-shot Needle Needle type: Sprotte  Needle gauge: 24 G Needle length: 9 cm Additional Notes Expiration date of kit checked and confirmed. Patient tolerated procedure well, without complications.

## 2016-08-02 NOTE — Op Note (Signed)
NAME:  Richard Martinez                      MEDICAL RECORD NO.:  [id number]                             FACILITY:  [facility]      PHYSICIAN:  Pietro Cassis. Alvan Dame, M.D.  DATE OF BIRTH:  Jan 08, 1961      DATE OF PROCEDURE:  08/02/2016                                     OPERATIVE REPORT   This was a simultaneous bilateral total knee procedure.  Below are templated procedures for each knee done under single anesthetic.  Left done first.  Patient had severe OA with significant osteophytes in both knees        PREOPERATIVE DIAGNOSIS:  Left knee osteoarthritis.      POSTOPERATIVE DIAGNOSIS:  Left knee osteoarthritis.      FINDINGS:  The patient was noted to have complete loss of cartilage and   bone-on-bone arthritis with associated osteophytes in the medial and patellofemoral compartments of   the knee with a significant synovitis and associated effusion.      PROCEDURE:  Left total knee replacement.      COMPONENTS USED:  DePuy Attune rotating platform posterior stabilized knee   system, a size 7 femur, 7 tibia, size 6 PS AOX insert, and 38 anatomic patellar   button.      SURGEON:  Pietro Cassis. Alvan Dame, M.D.      ASSISTANT:  Danae Orleans, PA-C.      ANESTHESIA:  Spinal.      SPECIMENS:  None.      COMPLICATION:  None.      DRAINS:  None.  EBL: 200cc      TOURNIQUET TIME:   Total Tourniquet Time Documented: Thigh (Left) - 48 minutes Total: Thigh (Left) - 48 minutes  Thigh (Right) - 57 minutes Total: Thigh (Right) - 57 minutes  .      The patient was stable to the recovery room.      INDICATION FOR PROCEDURE:  Richard Martinez is a 55 y.o. male patient of   mine.  The patient had been seen, evaluated, and treated conservatively in the   office with medication, activity modification, and injections.  The patient had   radiographic changes of bilateral knee bone-on-bone arthritis with endplate sclerosis and osteophytes noted.      The patient failed conservative measures including  medication, injections, and activity modification, and at this point was ready for more definitive measures.   Based on the radiographic changes and failed conservative measures, the patient   decided to proceed with simultaneous bilateral total knee replacements.  Risks of infection,   DVT, component failure, need for revision surgery, postop course, and   expectations were all   discussed and reviewed, specific risks of bilateral procedures also discussed.  Consent was obtained for benefit of pain   relief.      PROCEDURE IN DETAIL:  The patient was brought to the operative theater.   Once adequate anesthesia, preoperative antibiotics, 2 gm of Ancef, 1 gm of Tranexamic Acid, and 10 mg of Decadorn administered, the patient was positioned supine with bilateral thigh tourniquets placed.  Both lower extremities were prepped and draped in  sterile fashion.  A time-   out was performed identifying the patient, planned procedures, and   extremities.      The both lower extremities were placed in the [facility] leg holders.  The left leg was first  exsanguinated, tourniquet elevated to 250 mmHg.  A midline incision was   made followed by median parapatellar arthrotomy.  Following initial   exposure, attention was first directed to the patella.  Precut   measurement was noted to be 26 mm.  I resected down to 14 mm and used a   38 anatomic patellar button to restore patellar height as well as cover the cut   surface.      The lug holes were drilled and a metal shim was placed to protect the   patella from retractors and saw blades.      At this point, attention was now directed to the femur.  The femoral   canal was opened with a drill, irrigated to try to prevent fat emboli.  An   intramedullary rod was passed at 5 degrees valgus, 9 mm of bone was   resected off the distal femur.  Following this resection, the tibia was   subluxated anteriorly.  Using the extramedullary guide, 2 mm of bone was  resected off   the proximal medial tibia.  We confirmed the gap would be   stable medially and laterally with a size 5 spacer block as well as confirmed   the cut was perpendicular in the coronal plane, checking with an alignment rod.      Once this was done, I sized the femur to be a size 7 in the anterior-   posterior dimension, chose a standard component based on medial and   lateral dimension.  The size 7 rotation block was then pinned in   position anterior referenced using the C-clamp to set rotation.  The   anterior, posterior, and  chamfer cuts were made without difficulty nor   notching making certain that I was along the anterior cortex to help   with flexion gap stability.      The final box cut was made off the lateral aspect of distal femur.      At this point, the tibia was sized to be a size 7, the size 7 tray was   then pinned in position through the medial third of the tubercle,   drilled, and keel punched.  Trial reduction was now carried with a 7 femur,  7 tibia, a size 6 PS insert, and the 38 anatomic patella botton.  The knee was brought to   extension, full extension with good flexion stability with the patella   tracking through the trochlea without application of pressure.  Given   all these findings the femoral lug holes were drilled and then the trial components removed.  Final components were   opened and cement was mixed.  The knee was irrigated with normal saline   solution and pulse lavage.  The synovial lining was   then injected with one half of a combination of 30 cc of 0.25% Marcaine without epinephrine and 1 cc of Toradol plus 30 cc of NS for a total of 61 cc.      The knee was irrigated.  Final implants were then cemented onto clean and   dried cut surfaces of bone with the knee brought to extension with a size 6 trial insert.      Once the cement had fully  cured, the excess cement was removed   throughout the knee.  I confirmed I was satisfied with  the range of   motion and stability, and the final size 6 PS AOX insert was chosen.  It was   placed into the knee.      The tourniquet had been let down at 48 minutes.  No significant   hemostasis required.  The   extensor mechanism was then reapproximated using #1 Vicryl and #0 V-lock sutures with the knee   in flexion.  The   remaining wound was closed with 2-0 Vicryl and running 4-0 Monocryl.   The knee was cleaned, dried, dressed sterilely using Dermabond and   Aquacel dressing.  The patient was then   brought to recovery room in stable condition, tolerating the procedure   well.   Please note that Physician Assistant, Danae Orleans, PA-C, was present for the entirety of the case, and was utilized for pre-operative positioning, peri-operative retractor management, general facilitation of the procedure.  He was also utilized for primary wound closure at the end of the case.    Attention was now directed to the right knee.        Pietro Cassis Alvan Dame, M.D.    08/02/2016 1:06 PM  NAME:  Richard Martinez                      MEDICAL RECORD NO.:  [id number]                             FACILITY:  [facility]      PHYSICIAN:  Pietro Cassis. Alvan Dame, M.D.  DATE OF BIRTH:  05/05/1961      DATE OF PROCEDURE:  08/02/2016                                     OPERATIVE REPORT         PREOPERATIVE DIAGNOSIS:  Right knee osteoarthritis.      POSTOPERATIVE DIAGNOSIS:  Right knee osteoarthritis.      FINDINGS:  The patient was noted to have complete loss of cartilage and   bone-on-bone arthritis with associated osteophytes in all three compartments of   the knee with a significant synovitis and associated effusion.      PROCEDURE:  Right total knee replacement.      COMPONENTS USED:  DePuy Attune rotating platform posterior stabilized knee   system, a size 7 femur, 7 tibia, size 7 PS AOX insert, and 38 anatomic patellar   button.      SURGEON:  Pietro Cassis. Alvan Dame, M.D.      ASSISTANT:  Danae Orleans,  PA-C.      ANESTHESIA:  Spinal.      SPECIMENS:  None.      COMPLICATION:  None.      DRAINS:  None.  EBL: <200cc      TOURNIQUET TIME:   Total Tourniquet Time Documented: Thigh (Left) - 48 minutes Total: Thigh (Left) - 48 minutes  Thigh (Right) - 57 minutes Total: Thigh (Right) - 57 minutes  .      The patient was stable to the recovery room.      Indications as noted above     PROCEDURE IN DETAIL:      The right leg was now   exsanguinated, tourniquet elevated to 250 mmHg.  A midline incision was   made followed by median parapatellar arthrotomy.  Following initial   exposure, attention was first directed to the patella.  Precut   measurement was noted to be 26 mm.  I resected down to 14 mm and used a   38 anatomic patellar button to restore patellar height as well as cover the cut   surface.      The lug holes were drilled and a metal shim was placed to protect the   patella from retractors and saw blades.      At this point, attention was now directed to the femur.  The femoral   canal was opened with a drill, irrigated to try to prevent fat emboli.  An   intramedullary rod was passed at 5 degrees valgus, 9 mm of bone was   resected off the distal femur.  Following this resection, the tibia was   subluxated anteriorly.  Using the extramedullary guide, 2 mm of bone was resected off   the proximal medial tibia.  We confirmed the gap would be   stable medially and laterally with a size 5 spacer block as well as confirmed   the cut was perpendicular in the coronal plane, checking with an alignment rod.      Once this was done, I sized the femur to be a size 7 in the anterior-   posterior dimension, chose a standard component based on medial and   lateral dimension.  The size 7 rotation block was then pinned in   position anterior referenced using the C-clamp to set rotation.  The   anterior, posterior, and  chamfer cuts were made without difficulty nor   notching  making certain that I was along the anterior cortex to help   with flexion gap stability.      The final box cut was made off the lateral aspect of distal femur.      At this point, the tibia was sized to be a size 7, the size 7 tray was   then pinned in position through the medial third of the tubercle,   drilled, and keel punched.  Trial reduction was now carried with a 7 femur,  7 tibia, a size 6 then 7 PS insert, and the 38 anatomic patella botton.  The knee was brought to   extension, full extension with good flexion stability with the patella   tracking through the trochlea without application of pressure.  Given   all these findings the femoral lug holes were drilled and then the trial components removed.  Final components were   opened and cement was mixed.  The knee was irrigated with normal saline   solution and pulse lavage.  The synovial lining was   then injected with the other half of the 30 cc of 0.25% Marcaine without epinephrine and 1 cc of Toradol plus 30 cc of NS for the total of 61 cc.      The knee was irrigated.  Final implants were then cemented onto clean and   dried cut surfaces of bone with the knee brought to extension with a size 7 PS trial insert.      Once the cement had fully cured, the excess cement was removed   throughout the knee.  I confirmed I was satisfied with the range of   motion and stability, and the final size 7 PS AOX insert was chosen.  It was   placed into the knee.  The tourniquet had been let down at 57 minutes.  No significant   hemostasis required.  The   extensor mechanism was then reapproximated using #1 Vicryl and #0 V-lock sutures with the knee   in flexion.  The   remaining wound was closed with 2-0 Vicryl and running 4-0 Monocryl.   The knee was cleaned, dried, dressed sterilely using Dermabond and   Aquacel dressing.  The patient was then   brought to recovery room in stable condition, tolerating the procedure   well.    Please note that Physician Assistant, Danae Orleans, PA-C, was present for the entirety of the case, and was utilized for pre-operative positioning, peri-operative retractor management, general facilitation of the procedure.  He was also utilized for primary wound closure at the end of the case.              Pietro Cassis Alvan Dame, M.D.    08/02/2016 1:06 PM

## 2016-08-02 NOTE — Interval H&P Note (Signed)
History and Physical Interval Note:  08/02/2016 8:54 AM  Richard Martinez  has presented today for surgery, with the diagnosis of Osteoarthritis both knees  The various methods of treatment have been discussed with the patient and family. After consideration of risks, benefits and other options for treatment, the patient has consented to  Procedure(s): TOTAL KNEE BILATERAL (Bilateral) as a surgical intervention .  The patient's history has been reviewed, patient examined, no change in status, stable for surgery.  I have reviewed the patient's chart and labs.  Questions were answered to the patient's satisfaction.     [name]

## 2016-08-02 NOTE — Anesthesia Preprocedure Evaluation (Signed)
Anesthesia Evaluation  Patient identified by MRN, date of birth, ID band Patient awake    Reviewed: Allergy & Precautions, NPO status , Patient's Chart, lab work & pertinent test results  Airway Mallampati: II  TM Distance: >3 FB Neck ROM: Full    Dental no notable dental hx.    Pulmonary neg pulmonary ROS,    Pulmonary exam normal breath sounds clear to auscultation       Cardiovascular hypertension, Normal cardiovascular exam Rhythm:Regular Rate:Normal     Neuro/Psych negative neurological ROS  negative psych ROS   GI/Hepatic negative GI ROS, Neg liver ROS,   Endo/Other  negative endocrine ROS  Renal/GU negative Renal ROS  negative genitourinary   Musculoskeletal negative musculoskeletal ROS (+)   Abdominal   Peds negative pediatric ROS (+)  Hematology negative hematology ROS (+)   Anesthesia Other Findings   Reproductive/Obstetrics negative OB ROS                             Anesthesia Physical Anesthesia Plan  ASA: II  Anesthesia Plan: Spinal   Post-op Pain Management:    Induction: Intravenous  Airway Management Planned: Simple Face Mask  Additional Equipment:   Intra-op Plan:   Post-operative Plan:   Informed Consent: I have reviewed the patients History and Physical, chart, labs and discussed the procedure including the risks, benefits and alternatives for the proposed anesthesia with the patient or authorized representative who has indicated his/her understanding and acceptance.   Dental advisory given  Plan Discussed with: CRNA and Surgeon  Anesthesia Plan Comments:         Anesthesia Quick Evaluation  

## 2016-08-02 NOTE — Transfer of Care (Signed)
Immediate Anesthesia Transfer of Care Note  Patient: Richard Martinez  Procedure(s) Performed: Procedure(s): TOTAL KNEE BILATERAL (Bilateral)  Patient Location: PACU  Anesthesia Type:Spinal  Level of Consciousness:  sedated, patient cooperative and responds to stimulation  Airway & Oxygen Therapy:Patient Spontanous Breathing and Patient connected to face mask oxgen  Post-op Assessment:  Report given to PACU RN and Post -op Vital signs reviewed and stable  Post vital signs:  Reviewed and stable  Last Vitals:  Vitals:   08/02/16 0824  BP: (!) 147/86  Pulse: 87  Resp: 18  Temp: [id number] C    Complications: No apparent anesthesia complications

## 2016-08-02 NOTE — Anesthesia Postprocedure Evaluation (Signed)
Anesthesia Post Note  Patient: Richard Martinez  Procedure(s) Performed: Procedure(s) (LRB): TOTAL KNEE BILATERAL (Bilateral)  Patient location during evaluation: PACU Anesthesia Type: Spinal Level of consciousness: oriented and awake and alert Pain management: pain level controlled Vital Signs Assessment: post-procedure vital signs reviewed and stable Respiratory status: spontaneous breathing, respiratory function stable and patient connected to nasal cannula oxygen Cardiovascular status: blood pressure returned to baseline and stable Postop Assessment: no headache and no backache Anesthetic complications: no       Last Vitals:  Vitals:   08/02/16 1400 08/02/16 1415  BP: 115/84 116/74  Pulse: 70 70  Resp: 13 14  Temp:      Last Pain:  Vitals:   08/02/16 1415  TempSrc:   PainSc: 3                  Sacha Topor S

## 2016-08-02 NOTE — Discharge Instructions (Addendum)
INSTRUCTIONS AFTER JOINT REPLACEMENT  ° °o Remove items at home which could result in a fall. This includes throw rugs or furniture in walking pathways °o ICE to the affected joint every three hours while awake for 30 minutes at a time, for at least the first 3-5 days, and then as needed for pain and swelling.  Continue to use ice for pain and swelling. You may notice swelling that will progress down to the foot and ankle.  This is normal after surgery.  Elevate your leg when you are not up walking on it.   °o Continue to use the breathing machine you got in the hospital (incentive spirometer) which will help keep your temperature down.  It is common for your temperature to cycle up and down following surgery, especially at night when you are not up moving around and exerting yourself.  The breathing machine keeps your lungs expanded and your temperature down. ° ° °DIET:  As you were doing prior to hospitalization, we recommend a well-balanced diet. ° °DRESSING / WOUND CARE / SHOWERING ° °Keep the surgical dressing until follow up.  The dressing is water proof, so you can shower without any extra covering.  IF THE DRESSING FALLS OFF or the wound gets wet inside, change the dressing with sterile gauze.  Please use good hand washing techniques before changing the dressing.  Do not use any lotions or creams on the incision until instructed by your surgeon.   ° °ACTIVITY ° °o Increase activity slowly as tolerated, but follow the weight bearing instructions below.   °o No driving for 6 weeks or until further direction given by your physician.  You cannot drive while taking narcotics.  °o No lifting or carrying greater than 10 lbs. until further directed by your surgeon. °o Avoid periods of inactivity such as sitting longer than an hour when not asleep. This helps prevent blood clots.  °o You may return to work once you are authorized by your doctor.  ° ° ° °WEIGHT BEARING  ° °Weight bearing as tolerated with assist  device (walker, cane, etc) as directed, use it as long as suggested by your surgeon or therapist, typically at least 4-6 weeks. ° ° °EXERCISES ° °Results after joint replacement surgery are often greatly improved when you follow the exercise, range of motion and muscle strengthening exercises prescribed by your doctor. Safety measures are also important to protect the joint from further injury. Any time any of these exercises cause you to have increased pain or swelling, decrease what you are doing until you are comfortable again and then slowly increase them. If you have problems or questions, call your caregiver or physical therapist for advice.  ° °Rehabilitation is important following a joint replacement. After just a few days of immobilization, the muscles of the leg can become weakened and shrink (atrophy).  These exercises are designed to build up the tone and strength of the thigh and leg muscles and to improve motion. Often times heat used for twenty to thirty minutes before working out will loosen up your tissues and help with improving the range of motion but do not use heat for the first two weeks following surgery (sometimes heat can increase post-operative swelling).  ° °These exercises can be done on a training (exercise) mat, on the floor, on a table or on a bed. Use whatever works the best and is most comfortable for you.    Use music or television while you are exercising so that   the exercises are a pleasant break in your day. This will make your life better with the exercises acting as a break in your routine that you can look forward to.   Perform all exercises about fifteen times, three times per day or as directed.  You should exercise both the operative leg and the other leg as well. ° °Exercises include: °  °• Quad Sets - Tighten up the muscle on the front of the thigh (Quad) and hold for 5-10 seconds.   °• Straight Leg Raises - With your knee straight (if you were given a brace, keep it on),  lift the leg to 60 degrees, hold for 3 seconds, and slowly lower the leg.  Perform this exercise against resistance later as your leg gets stronger.  °• Leg Slides: Lying on your back, slowly slide your foot toward your buttocks, bending your knee up off the floor (only go as far as is comfortable). Then slowly slide your foot back down until your leg is flat on the floor again.  °• Angel Wings: Lying on your back spread your legs to the side as far apart as you can without causing discomfort.  °• Hamstring Strength:  Lying on your back, push your heel against the floor with your leg straight by tightening up the muscles of your buttocks.  Repeat, but this time bend your knee to a comfortable angle, and push your heel against the floor.  You may put a pillow under the heel to make it more comfortable if necessary.  ° °A rehabilitation program following joint replacement surgery can speed recovery and prevent re-injury in the future due to weakened muscles. Contact your doctor or a physical therapist for more information on knee rehabilitation.  ° ° °CONSTIPATION ° °Constipation is defined medically as fewer than three stools per week and severe constipation as less than one stool per week.  Even if you have a regular bowel pattern at home, your normal regimen is likely to be disrupted due to multiple reasons following surgery.  Combination of anesthesia, postoperative narcotics, change in appetite and fluid intake all can affect your bowels.  ° °YOU MUST use at least one of the following options; they are listed in order of increasing strength to get the job done.  They are all available over the counter, and you may need to use some, POSSIBLY even all of these options:   ° °Drink plenty of fluids (prune juice may be helpful) and high fiber foods °Colace 100 mg by mouth twice a day  °Senokot for constipation as directed and as needed Dulcolax (bisacodyl), take with full glass of water  °Miralax (polyethylene glycol)  once or twice a day as needed. ° °If you have tried all these things and are unable to have a bowel movement in the first 3-4 days after surgery call either your surgeon or your primary doctor.   ° °If you experience loose stools or diarrhea, hold the medications until you stool forms back up.  If your symptoms do not get better within 1 week or if they get worse, check with your doctor.  If you experience "the worst abdominal pain ever" or develop nausea or vomiting, please contact the office immediately for further recommendations for treatment. ° ° °ITCHING:  If you experience itching with your medications, try taking only a single pain pill, or even half a pain pill at a time.  You can also use Benadryl over the counter for itching or also to   help with sleep.  ° °TED HOSE STOCKINGS:  Use stockings on both legs until for at least 2 weeks or as directed by physician office. They may be removed at night for sleeping. ° °MEDICATIONS:  See your medication summary on the “After Visit Summary” that nursing will review with you.  You may have some home medications which will be placed on hold until you complete the course of blood thinner medication.  It is important for you to complete the blood thinner medication as prescribed. ° °PRECAUTIONS:  If you experience chest pain or shortness of breath - call 911 immediately for transfer to the hospital emergency department.  ° °If you develop a fever greater that 101 F, purulent drainage from wound, increased redness or drainage from wound, foul odor from the wound/dressing, or calf pain - CONTACT YOUR SURGEON.   °                                                °FOLLOW-UP APPOINTMENTS:  If you do not already have a post-op appointment, please call the office for an appointment to be seen by your surgeon.  Guidelines for how soon to be seen are listed in your “After Visit Summary”, but are typically between 1-4 weeks after surgery. ° °OTHER INSTRUCTIONS:  ° °Knee  Replacement:  Do not place pillow under knee, focus on keeping the knee straight while resting.  ° °MAKE SURE YOU:  °• Understand these instructions.  °• Get help right away if you are not doing well or get worse.  ° ° °Thank you for letting us be a part of your medical care team.  It is a privilege we respect greatly.  We hope these instructions will help you stay on track for a fast and full recovery!  ° ° °Information on my medicine - XARELTO® (Rivaroxaban) ° °This medication education was reviewed with me or my healthcare representative as part of my discharge preparation.  The pharmacist that spoke with me during my hospital stay was:  Zaxton Angerer M, RPH ° °Why was Xarelto® prescribed for you? °Xarelto® was prescribed for you to reduce the risk of blood clots forming after orthopedic surgery. The medical term for these abnormal blood clots is venous thromboembolism (VTE). ° °What do you need to know about xarelto® ? °Take your Xarelto® ONCE DAILY at the same time every day. °You may take it either with or without food. ° °If you have difficulty swallowing the tablet whole, you may crush it and mix in applesauce just prior to taking your dose. ° °Take Xarelto® exactly as prescribed by your doctor and DO NOT stop taking Xarelto® without talking to the doctor who prescribed the medication.  Stopping without other VTE prevention medication to take the place of Xarelto® may increase your risk of developing a clot. ° °After discharge, you should have regular check-up appointments with your healthcare provider that is prescribing your Xarelto®.   ° °What do you do if you miss a dose? °If you miss a dose, take it as soon as you remember on the same day then continue your regularly scheduled once daily regimen the next day. Do not take two doses of Xarelto® on the same day.  ° °Important Safety Information °A possible side effect of Xarelto® is bleeding. You should call your healthcare provider right away if you    experience any of the following: °? Bleeding from an injury or your nose that does not stop. °? Unusual colored urine (red or dark brown) or unusual colored stools (red or black). °? Unusual bruising for unknown reasons. °? A serious fall or if you hit your head (even if there is no bleeding). ° °Some medicines may interact with Xarelto® and might increase your risk of bleeding while on Xarelto®. To help avoid this, consult your healthcare provider or pharmacist prior to using any new prescription or non-prescription medications, including herbals, vitamins, non-steroidal anti-inflammatory drugs (NSAIDs) and supplements. ° °This website has more information on Xarelto®: www.xarelto.com. ° ° °

## 2016-08-03 ENCOUNTER — Encounter (HOSPITAL_COMMUNITY): Payer: Self-pay | Admitting: Orthopedic Surgery

## 2016-08-03 LAB — CBC
HCT: 34.5 % — ABNORMAL LOW (ref 39.0–52.0)
Hemoglobin: 11.8 g/dL — ABNORMAL LOW (ref 13.0–17.0)
MCH: 29.6 pg (ref 26.0–34.0)
MCHC: 34.2 g/dL (ref 30.0–36.0)
MCV: 86.7 fL (ref 78.0–100.0)
PLATELETS: 264 10*3/uL (ref 150–400)
RBC: 3.98 MIL/uL — ABNORMAL LOW (ref 4.22–5.81)
RDW: 13.4 % (ref 11.5–15.5)
WBC: 17.8 10*3/uL — AB (ref 4.0–10.5)

## 2016-08-03 LAB — BASIC METABOLIC PANEL
ANION GAP: 5 (ref 5–15)
BUN: 19 mg/dL (ref 6–20)
CALCIUM: 8.2 mg/dL — AB (ref 8.9–10.3)
CO2: 29 mmol/L (ref 22–32)
CREATININE: 1.11 mg/dL (ref 0.61–1.24)
Chloride: 102 mmol/L (ref 101–111)
GLUCOSE: 129 mg/dL — AB (ref 65–99)
Potassium: 4.4 mmol/L (ref 3.5–5.1)
Sodium: 136 mmol/L (ref 135–145)

## 2016-08-03 NOTE — Progress Notes (Signed)
Occupational Therapy Evaluation Patient Details Name: Richard Martinez MRN: [id number] DOB: 10-25-1960 Today's Date: 08/03/2016    History of Present Illness 55 yo male s/p bil TKA 08/02/16.    Clinical Impression   PTA, pt independent and very active. Pt making excellent progress. Began education on compensatory techniques for ADL and functional mobility for ADL. Will follow acutely to complete education and facilitate safe DC home.     Follow Up Recommendations  No OT follow up;Supervision - Intermittent    Equipment Recommendations  None recommended by OT    Recommendations for Other Services       Precautions / Restrictions Precautions Precautions: Fall;Knee Restrictions Weight Bearing Restrictions: No RLE Weight Bearing: Weight bearing as tolerated LLE Weight Bearing: Weight bearing as tolerated      Mobility Bed Mobility Overal bed mobility: Modified Independent Bed Mobility: Sit to Supine     Supine to sit: Supervision Sit to supine: Modified independent (Device/Increase time)   General bed mobility comments: for safety  Transfers Overall transfer level: Needs assistance Equipment used: Rolling walker (2 wheeled) Transfers: Sit to/from Stand Sit to Stand: Min guard         General transfer comment: Able to stand from recliner without difficulty    Balance      standing - fair                                      ADL Overall ADL's : Needs assistance/impaired         Upper Body Bathing: Set up;Sitting   Lower Body Bathing: Minimal assistance;Sit to/from stand   Upper Body Dressing : Set up;Sitting   Lower Body Dressing: Minimal assistance;Sit to/from stand   Toilet Transfer: Min guard;Ambulation;BSC;RW   Toileting- Clothing Manipulation and Hygiene: Minimal assistance       Functional mobility during ADLs: Min guard;Rolling walker;Cueing for safety General ADL Comments: A to donn underwear over feet. cues to safely  use RW during turns to keep body "in" RW     Vision     Perception     Praxis      Pertinent Vitals/Pain Pain Assessment: 0-10 Pain Score: 4  Pain Location: B knees Pain Descriptors / Indicators: Aching;Burning;Sore Pain Intervention(s): Limited activity within patient's tolerance;Ice applied;Repositioned     Hand Dominance     Extremity/Trunk Assessment Upper Extremity Assessment Upper Extremity Assessment: Overall WFL for tasks assessed   Lower Extremity Assessment Lower Extremity Assessment: Defer to PT evaluation   Cervical / Trunk Assessment Cervical / Trunk Assessment: Normal   Communication Communication Communication: No difficulties   Cognition Arousal/Alertness: Awake/alert Behavior During Therapy: WFL for tasks assessed/performed Overall Cognitive Status: Within Functional Limits for tasks assessed                     General Comments       Exercises  Educated on importance of terminal extension     Shoulder Instructions      Home Living Family/patient expects to be discharged to:: Private residence Living Arrangements: Spouse/significant other Available Help at Discharge: Family;Available 24 hours/day Type of Home: House Home Access: Stairs to [employer] of Steps: 1   Home Layout: One level     Bathroom Shower/Tub: Occupational psychologist: Standard Bathroom Accessibility: Yes How Accessible: Accessible via walker Home Equipment:  (plans to borrow 3in1 and RW)  Additional Comments: has borrowed RW, 3n1, cane      Prior Functioning/Environment Level of Independence: Independent        Comments: very actuve        OT Problem List: Decreased strength;Decreased range of motion;Decreased activity tolerance;Decreased knowledge of use of DME or AE;Obesity;Pain   OT Treatment/Interventions: Self-care/ADL training;DME and/or AE instruction;Therapeutic activities;Patient/family education    OT  Goals(Current goals can be found in the care plan section) Acute Rehab OT Goals Patient Stated Goal: regain PLOF OT Goal Formulation: With patient Time For Goal Achievement: 08/10/16 Potential to Achieve Goals: Good ADL Goals Pt Will Perform Lower Body Bathing: with supervision;sit to/from stand;with set-up Pt Will Perform Lower Body Dressing: with set-up;with supervision;sit to/from stand Pt Will Perform Tub/Shower Transfer: with min guard assist;with caregiver independent in assisting;ambulating;3 in 1;Shower transfer  OT Frequency: Min 2X/week   Barriers to D/C:            Co-evaluation              End of Session Equipment Utilized During Treatment: Surveyor, mining Communication: Mobility status  Activity Tolerance: Patient tolerated treatment well Patient left: in bed;with call bell/phone within reach;with family/visitor present   Time: [id number] OT Time Calculation (min): 24 min Charges:  OT General Charges $OT Visit: 1 Procedure OT Evaluation $OT Eval Moderate Complexity: 1 Procedure OT Treatments $Self Care/Home Management : 8-22 mins G-Codes:    Marelly Wehrman,HILLARY 08/11/2016, 1:09 PM   [facility], OT/L  [id number] 08-11-2016

## 2016-08-03 NOTE — Progress Notes (Signed)
Patient ID: Richard Martinez, male   DOB: 08-Feb-1961, 55 y.o.   MRN: [id number] Subjective: 1 Day Post-Op Procedure(s) (LRB): TOTAL KNEE BILATERAL (Bilateral)    Patient reports pain as moderate but well controlled at this point.  Ready to begin moving with therapy as he has already been doing exercises in bed this am  Objective:   VITALS:   Vitals:   08/03/16 0219 08/03/16 0555  BP: (!) 115/53 109/65  Pulse: 78 76  Resp: 16 16  Temp: 98.1 F (36.7 C) 97.6 F (36.4 C)    Neurovascular intact Incision: dressing C/D/I, bilaterally  LABS  Recent Labs  08/03/16 0402  HGB 11.8*  HCT 34.5*  WBC 17.8*  PLT 264     Recent Labs  08/03/16 0402  NA 136  K 4.4  BUN 19  CREATININE 1.11  GLUCOSE 129*    No results for input(s): LABPT, INR in the last 72 hours.   Assessment/Plan: 1 Day Post-Op Procedure(s) (LRB): TOTAL KNEE BILATERAL (Bilateral)   Advance diet Up with therapy Plan for discharge tomorrow  Very motivated

## 2016-08-03 NOTE — Care Management Note (Signed)
Case Management Note  Patient Details  Name: Richard Martinez MRN: [id number] Date of Birth: 1961-01-10  Subjective/Objective:                  TOTAL KNEE BILATERAL (Bilateral) Action/Plan: Discharge planning Expected Discharge Date:  08/04/16               Expected Discharge Plan:  [place]  In-House Referral:     Discharge planning Services  CM Consult  Post Acute Care Choice:  Home Health Choice offered to:  Patient  DME Arranged:  N/A DME Agency:  NA  HH Arranged:  PT [place] Agency:  [facility]  Status of Service:  Completed, signed off  If discussed at [employer], dates discussed:    Additional Comments: CM met with pt in room to offer choice of home health agency. Pt [facility] to render HHPT. Referral givento [facility] rep, Tim.  Pt has borrowed both a rolling walker and a 3n1. NO other CM needs were communicated. Dellie Catholic, RN 08/03/2016, 3:23 PM

## 2016-08-03 NOTE — Evaluation (Signed)
Physical Therapy Evaluation Patient Details Name: EMANUELL CLAUSSEN MRN: [id number] DOB: 08-28-1960 Today's Date: 08/03/2016   History of Present Illness  55 yo male s/p bil TKA 08/02/16.   Clinical Impression  On eval, pt required Min assist for mobility. He walked ~60 feet with a RW. Pain rated 5/10 with activity. Pt tolerated activity well. Will follow and progress activity as tolerated.     Follow Up Recommendations Home health PT    Equipment Recommendations   (pt states he has borrowed DME)    Recommendations for Other Services OT consult     Precautions / Restrictions Precautions Precautions: Fall;Knee Restrictions Weight Bearing Restrictions: No RLE Weight Bearing: Weight bearing as tolerated LLE Weight Bearing: Weight bearing as tolerated      Mobility  Bed Mobility Overal bed mobility: Needs Assistance Bed Mobility: Supine to Sit     Supine to sit: Supervision     General bed mobility comments: for safety  Transfers Overall transfer level: Needs assistance Equipment used: Rolling walker (2 wheeled) Transfers: Sit to/from Stand Sit to Stand: Min assist;From elevated surface         General transfer comment: Assist to rise, stabilize, control descent. VCs safety, technique, hand/LE placement.   Ambulation/Gait Ambulation/Gait assistance: Min assist Ambulation Distance (Feet): 60 Feet Assistive device: Rolling walker (2 wheeled) Gait Pattern/deviations: Step-to pattern     General Gait Details: Assist to stabilize intermittently. VCs safety, distance from RW.   Stairs            Wheelchair Mobility    Modified Rankin (Stroke Patients Only)       Balance                                             Pertinent Vitals/Pain Pain Assessment: 0-10 Pain Score: 5  Pain Location: L knee more than R Pain Descriptors / Indicators: Aching;Burning;Sore Pain Intervention(s): Repositioned;Ice applied    Home Living  Family/patient expects to be discharged to:: Private residence Living Arrangements: Spouse/significant other Available Help at Discharge: Family Type of Home: House Home Access: Stairs to [place]   Technical brewer of Steps: 1 Home Layout: One level   Additional Comments: has borrowed RW, 3n1, cane    Prior Function Level of Independence: Independent               Hand Dominance        Extremity/Trunk Assessment   Upper Extremity Assessment Upper Extremity Assessment: Defer to OT evaluation    Lower Extremity Assessment Lower Extremity Assessment: Generalized weakness    Cervical / Trunk Assessment Cervical / Trunk Assessment: Normal  Communication   Communication: No difficulties  Cognition Arousal/Alertness: Awake/alert Behavior During Therapy: WFL for tasks assessed/performed Overall Cognitive Status: Within Functional Limits for tasks assessed                      General Comments      Exercises Total Joint Exercises Ankle Circles/Pumps: AROM;Both;10 reps;Supine Quad Sets: AROM;10 reps;Supine Hip ABduction/ADduction: AROM;Both;10 reps;Supine Straight Leg Raises: AROM;Both;10 reps;Supine Knee Flexion: AAROM;Both;10 reps;Seated Goniometric ROM: R: ~5-90 degrees; L: ~5-80 degrees   Assessment/Plan    PT Assessment Patient needs continued PT services  PT Problem List Decreased strength;Decreased mobility;Decreased range of motion;Decreased activity tolerance;Decreased balance;Decreased knowledge of use of DME;Pain          PT Treatment Interventions  DME instruction;Therapeutic activities;Therapeutic exercise;Patient/family education;Functional mobility training;Balance training;Gait training;Stair training    PT Goals (Current goals can be found in the Care Plan section)  Acute Rehab PT Goals Patient Stated Goal: regain PLOF PT Goal Formulation: With patient/family Time For Goal Achievement: 08/17/16 Potential to Achieve Goals:  Good    Frequency 7X/week   Barriers to discharge        Co-evaluation               End of Session Equipment Utilized During Treatment: Gait belt Activity Tolerance: Patient tolerated treatment well Patient left: in chair;with call bell/phone within reach;with family/visitor present           Time: 0902-0929 PT Time Calculation (min) (ACUTE ONLY): 27 min   Charges:   PT Evaluation $PT Eval Low Complexity: 1 Procedure PT Treatments $Gait Training: 8-22 mins   PT G Codes:        Weston Anna, MPT Pager: [phone]

## 2016-08-03 NOTE — Progress Notes (Signed)
Physical Therapy Treatment Patient Details Name: Richard Martinez MRN: [id number] DOB: [birth year] Today's Date: 08/03/2016    History of Present Illness 55 yo male s/p bil TKA 08/02/16.     PT Comments    Progressing well with mobility.   Follow Up Recommendations  Home health PT     Equipment Recommendations   (pt states he has borrowed DME)    Recommendations for Other Services OT consult     Precautions / Restrictions Precautions Precautions: Fall;Knee Restrictions Weight Bearing Restrictions: No RLE Weight Bearing: Weight bearing as tolerated LLE Weight Bearing: Weight bearing as tolerated    Mobility  Bed Mobility Overal bed mobility: Modified Independent Bed Mobility: Sit to Supine     Supine to sit: Supervision Sit to supine: Modified independent (Device/Increase time)   General bed mobility comments: for safety  Transfers Overall transfer level: Needs assistance Equipment used: Rolling walker (2 wheeled) Transfers: Sit to/from Stand Sit to Stand: Min guard;From elevated surface         General transfer comment: close guard for safety. VCs safety, hand/LE placement  Ambulation/Gait Ambulation/Gait assistance: Min guard Ambulation Distance (Feet): 125 Feet Assistive device: Rolling walker (2 wheeled) Gait Pattern/deviations: Step-to pattern     General Gait Details: close guard for safety. Pt reports intermittent R knee instability.    Stairs            Wheelchair Mobility    Modified Rankin (Stroke Patients Only)       Balance                                    Cognition Arousal/Alertness: Awake/alert Behavior During Therapy: WFL for tasks assessed/performed Overall Cognitive Status: Within Functional Limits for tasks assessed                      Exercises Total Joint Exercises Ankle Circles/Pumps: AROM;Both;10 reps;Supine Quad Sets: AROM;10 reps;Supine Hip ABduction/ADduction: AROM;Both;10  reps;Supine Straight Leg Raises: AROM;Both;10 reps;Supine Knee Flexion: AAROM;Both;10 reps;Seated Goniometric ROM: R: ~5-90 degrees; L: ~5-80 degrees    General Comments        Pertinent Vitals/Pain Pain Assessment: 0-10 Pain Score: 4  Pain Location: bil knees Pain Descriptors / Indicators: Aching;Sore;Burning Pain Intervention(s): Monitored during session;Repositioned;Ice applied    Home Living Family/patient expects to be discharged to:: Private residence Living Arrangements: Spouse/significant other Available Help at Discharge: Family;Available 24 hours/day Type of Home: House Home Access: Stairs to [place]   Home Layout: One level Home Equipment:  (plans to borrow 3in1 and RW) Additional Comments: has borrowed RW, 3n1, cane    Prior Function Level of Independence: Independent      Comments: very actuve   PT Goals (current goals can now be found in the care plan section) Acute Rehab PT Goals Patient Stated Goal: regain PLOF PT Goal Formulation: With patient/family Time For Goal Achievement: 08/17/16 Potential to Achieve Goals: Good Progress towards PT goals: Progressing toward goals    Frequency    7X/week      PT Plan Current plan remains appropriate    Co-evaluation             End of Session Equipment Utilized During Treatment: Gait belt Activity Tolerance: Patient tolerated treatment well Patient left: with call bell/phone within reach     Time: 1319-1334 PT Time Calculation (min) (ACUTE ONLY): 15 min  Charges:  $Gait Training: 8-22 mins  G Codes:      Weston Anna, MPT Pager: [phone]

## 2016-08-04 LAB — CBC
HEMATOCRIT: 30.2 % — AB (ref 39.0–52.0)
HEMOGLOBIN: 10.4 g/dL — AB (ref 13.0–17.0)
MCH: 29.2 pg (ref 26.0–34.0)
MCHC: 34.4 g/dL (ref 30.0–36.0)
MCV: 84.8 fL (ref 78.0–100.0)
Platelets: 244 10*3/uL (ref 150–400)
RBC: 3.56 MIL/uL — AB (ref 4.22–5.81)
RDW: 13.2 % (ref 11.5–15.5)
WBC: 15.6 10*3/uL — AB (ref 4.0–10.5)

## 2016-08-04 LAB — BASIC METABOLIC PANEL
ANION GAP: 6 (ref 5–15)
BUN: 18 mg/dL (ref 6–20)
CALCIUM: 8.6 mg/dL — AB (ref 8.9–10.3)
CHLORIDE: 103 mmol/L (ref 101–111)
CO2: 29 mmol/L (ref 22–32)
Creatinine, Ser: 0.97 mg/dL (ref 0.61–1.24)
GFR calc non Af Amer: >60 mL/min (ref >60)
Glucose, Bld: 117 mg/dL — ABNORMAL HIGH (ref 65–99)
Potassium: 4 mmol/L (ref 3.5–5.1)
Sodium: 138 mmol/L (ref 135–145)

## 2016-08-04 MED ORDER — ASPIRIN EC 81 MG PO TBEC: 81.0000 mg | DELAYED_RELEASE_TABLET | Freq: Two times a day (BID) | ORAL | 0 refills | Status: AC

## 2016-08-04 MED ORDER — HYDROCODONE-ACETAMINOPHEN 7.5-325 MG PO TABS
1.0000 | ORAL_TABLET | ORAL | 0 refills | Status: AC | PRN
Start: 2016-08-04 — End: ?

## 2016-08-04 MED ORDER — DOCUSATE SODIUM 100 MG PO CAPS: 100.0000 mg | ORAL_CAPSULE | Freq: Two times a day (BID) | ORAL | 0 refills | Status: AC

## 2016-08-04 MED ORDER — POLYETHYLENE GLYCOL 3350 17 G PO PACK: 17.0000 g | PACK | Freq: Two times a day (BID) | ORAL | 0 refills | Status: AC

## 2016-08-04 MED ORDER — FERROUS SULFATE 325 (65 FE) MG PO TABS: 325.0000 mg | ORAL_TABLET | Freq: Three times a day (TID) | ORAL | 3 refills | Status: AC

## 2016-08-04 MED ORDER — METHOCARBAMOL 500 MG PO TABS: 500.0000 mg | ORAL_TABLET | Freq: Four times a day (QID) | ORAL | 0 refills | Status: AC | PRN

## 2016-08-04 MED ORDER — RIVAROXABAN 10 MG PO TABS: 10.0000 mg | ORAL_TABLET | ORAL | 0 refills | Status: AC

## 2016-08-04 MED ORDER — CELECOXIB 200 MG PO CAPS
200.0000 mg | ORAL_CAPSULE | Freq: Two times a day (BID) | ORAL | 0 refills | Status: AC
Start: 2016-08-04 — End: ?

## 2016-08-04 NOTE — Progress Notes (Signed)
Physical Therapy Treatment Patient Details Name: Richard Martinez MRN: [id number] DOB: 01-20-1961 Today's Date: 08/04/2016    History of Present Illness 55 yo male s/p bil TKA 08/02/16.     PT Comments    Pt continues to participate well. All education completed. Ready to d/c from PT standpoint-RN aware.   Follow Up Recommendations  Home health PT;Supervision for mobility/OOB     Equipment Recommendations   (pt states he will borrow DME)    Recommendations for Other Services       Precautions / Restrictions Precautions Precautions: Fall;Knee Restrictions Weight Bearing Restrictions: No RLE Weight Bearing: Weight bearing as tolerated LLE Weight Bearing: Weight bearing as tolerated    Mobility  Bed Mobility Overal bed mobility: Needs Assistance Bed Mobility: Supine to Sit;Sit to Supine     Supine to sit: Modified independent (Device/Increase time);HOB elevated Sit to supine: Modified independent (Device/Increase time);HOB elevated      Transfers Overall transfer level: Needs assistance Equipment used: Rolling walker (2 wheeled) Transfers: Sit to/from Stand Sit to Stand: Min guard;From elevated surface         General transfer comment: for safety  Ambulation/Gait Ambulation/Gait assistance: Min guard Ambulation Distance (Feet): 175 Feet Assistive device: Rolling walker (2 wheeled) Gait Pattern/deviations: Step-through pattern;Decreased stride length     General Gait Details: close guard for safety. Pt reports some intermittent knee instability. Cues to exercise caution and for safe walker use.   Stairs            Wheelchair Mobility    Modified Rankin (Stroke Patients Only)       Balance                                    Cognition Arousal/Alertness: Awake/alert Behavior During Therapy: WFL for tasks assessed/performed Overall Cognitive Status: Within Functional Limits for tasks assessed                       Exercises Total Joint Exercises Ankle Circles/Pumps: AROM;Both;10 reps;Supine Quad Sets: AROM;10 reps;Supine Straight Leg Raises: AROM;Both;10 reps;Supine Knee Flexion: AAROM;Both;10 reps;Seated Goniometric ROM: R: ~5-100 degrees; L: ~5-90 degrees    General Comments        Pertinent Vitals/Pain Pain Assessment: 0-10 Pain Score: 4  Pain Location: bil knees Pain Descriptors / Indicators: Sore;Tightness Pain Intervention(s): Monitored during session;Repositioned;Ice applied    Home Living                      Prior Function            PT Goals (current goals can now be found in the care plan section) Progress towards PT goals: Progressing toward goals    Frequency    7X/week      PT Plan Current plan remains appropriate    Co-evaluation             End of Session Equipment Utilized During Treatment: Gait belt Activity Tolerance: Patient tolerated treatment well Patient left: with call bell/phone within reach     Time: 1319-1331 PT Time Calculation (min) (ACUTE ONLY): 12 min  Charges:  $Gait Training: 8-22 mins $Therapeutic Exercise: 8-22 mins                    G Codes:      Weston Anna, MPT Pager: [phone]

## 2016-08-04 NOTE — Progress Notes (Signed)
     Subjective: 2 Days Post-Op Procedure(s) (LRB): TOTAL KNEE BILATERAL (Bilateral)   Patient reports pain as mild, pain controlled. No events throughout the night.  Feels some soreness in the knees, but overall doing well.   Ready to be discharged home.   Objective:   VITALS:   Vitals:   08/03/16 2056 08/04/16 0544  BP: 130/72 130/75  Pulse: 81 80  Resp: 16 16  Temp: 98.4 F (36.9 C) 98.2 F (36.8 C)    Dorsiflexion/Plantar flexion intact Incision: dressing C/D/I No cellulitis present Compartment soft  LABS  Recent Labs  08/03/16 0402 08/04/16 0431  HGB 11.8* 10.4*  HCT 34.5* 30.2*  WBC 17.8* 15.6*  PLT 264 244     Recent Labs  08/03/16 0402 08/04/16 0431  NA 136 138  K 4.4 4.0  BUN 19 18  CREATININE 1.11 0.97  GLUCOSE 129* 117*     Assessment/Plan: 2 Days Post-Op Procedure(s) (LRB): TOTAL KNEE BILATERAL (Bilateral) Up with therapy Discharge home with home health  Follow up in 2 weeks at [facility]. Follow up with OLIN,Rafael Quesada D in 2 weeks.  Contact information:  [facility] [address], [place] [address] [phone]    Morbid Obesity (BMI >40)  Estimated body mass index is 40.52 kg/m as calculated from the following:   Height as of this encounter: 6' (1.829 m).   Weight as of this encounter: 135.5 kg (298 lb 12 oz). Patient also counseled that weight may inhibit the healing process Patient counseled that losing weight will help with future health issues        West Pugh. Jamario Colina   PAC  08/04/2016, 8:29 AM

## 2016-08-04 NOTE — Progress Notes (Signed)
Physical Therapy Treatment Patient Details Name: Richard Martinez MRN: [id number] DOB: Mar 27, 1961 Today's Date: 08/04/2016    History of Present Illness 55 yo male s/p bil TKA 08/02/16.     PT Comments    Progressing with mobility. Pt continues to perform well. He has requested a 2nd session with PT prior to d/c today so I will return later this afternoon.   Follow Up Recommendations  Home health PT;Supervision for mobility/OOB     Equipment Recommendations   (pt states he will borrow DME)    Recommendations for Other Services       Precautions / Restrictions Precautions Precautions: Fall;Knee Restrictions Weight Bearing Restrictions: No RLE Weight Bearing: Weight bearing as tolerated LLE Weight Bearing: Weight bearing as tolerated    Mobility  Bed Mobility Overal bed mobility: Modified Independent                Transfers Overall transfer level: Needs assistance Equipment used: Rolling walker (2 wheeled) Transfers: Sit to/from Stand Sit to Stand: Min guard;From elevated surface         General transfer comment: close guard for safety. VCs safety, hand/LE placement  Ambulation/Gait Ambulation/Gait assistance: Min guard Ambulation Distance (Feet): 150 Feet Assistive device: Rolling walker (2 wheeled) Gait Pattern/deviations: Step-through pattern;Decreased stride length     General Gait Details: close guard for safety. Pt reports intermittent R knee instability.    Stairs Min guard assist Forwards; With walker  VCs safety, technique, sequence. Wife present to observe as well.           Wheelchair Mobility    Modified Rankin (Stroke Patients Only)       Balance                                    Cognition Arousal/Alertness: Awake/alert Behavior During Therapy: WFL for tasks assessed/performed Overall Cognitive Status: Within Functional Limits for tasks assessed                      Exercises Total Joint  Exercises Ankle Circles/Pumps: AROM;Both;10 reps;Supine Quad Sets: AROM;10 reps;Supine Straight Leg Raises: AROM;Both;10 reps;Supine Knee Flexion: AAROM;Both;10 reps;Seated Goniometric ROM: R: ~5-100 degrees; L: ~5-90 degrees    General Comments        Pertinent Vitals/Pain Pain Assessment: 0-10 Pain Score: 4  Pain Location: bil knees with activity Pain Descriptors / Indicators: Aching;Sore;Burning Pain Intervention(s): Monitored during session;Repositioned;Ice applied    Home Living                      Prior Function            PT Goals (current goals can now be found in the care plan section) Progress towards PT goals: Progressing toward goals    Frequency    7X/week      PT Plan Current plan remains appropriate    Co-evaluation             End of Session Equipment Utilized During Treatment: Gait belt Activity Tolerance: Patient tolerated treatment well Patient left: in chair;with call bell/phone within reach;with family/visitor present     Time: [id number] PT Time Calculation (min) (ACUTE ONLY): 23 min  Charges:  $Gait Training: 8-22 mins $Therapeutic Exercise: 8-22 mins                    G Codes:      Ozella Almond  Starleen Blue, MPT Pager: [phone]

## 2016-08-10 NOTE — Discharge Summary (Signed)
Physician Discharge Summary  Patient ID: Richard Martinez MRN: [id number] DOB/AGE: [birth year] 55 y.o.  Admit date: 08/02/2016 Discharge date: 08/04/2016   Procedures:  Procedure(s) (LRB): TOTAL KNEE BILATERAL (Bilateral)  Attending Physician:  Dr. Paralee Cancel   Admission Diagnoses:   Bilateral knee primary OA / pain  Discharge Diagnoses:  Active Problems:   S/P knee replacement   Morbid obesity ([place])  Past Medical History:  Diagnosis Date  . Arthritis   . GERD (gastroesophageal reflux disease)   . Gout   . Hyperlipidemia   . Hypertension   . Obesity     HPI:    Richard Martinez, 55 y.o. male, has a history of pain and functional disability in the bilateral knee due to arthritis and has failed non-surgical conservative treatments for greater than 12 weeks to include NSAID's and/or analgesics, corticosteriod injections, use of assistive devices and activity modification.  Onset of symptoms was gradual, starting >10 years ago with gradually worsening course since that time. The patient noted prior procedures on the knee to include  arthroscopy on the bilateral knees.  Patient currently rates pain in the bilateral knees at 7 out of 10 with activity. Patient has worsening of pain with activity and weight bearing, pain that interferes with activities of daily living, pain with passive range of motion, crepitus and joint swelling.  Patient has evidence of periarticular osteophytes and joint space narrowing by imaging studies. There is no active infection.   Risks, benefits and expectations were discussed with the patient.  Risks including but not limited to the risk of anesthesia, blood clots, nerve damage, blood vessel damage, failure of the prosthesis, infection and up to and including death.  Patient understand the risks, benefits and expectations and wishes to proceed with surgery.   PCP: Joycelyn Man, MD   Discharged Condition: good  Hospital Course:  Patient underwent the above  stated procedure on 08/02/2016. Patient tolerated the procedure well and brought to the recovery room in good condition and subsequently to the floor.  POD #1 BP: 109/65 ; Pulse: 76 ; Temp: 97.6 F (36.4 C) ; Resp: 16 Patient reports pain as moderate but well controlled at this point.  Ready to begin moving with therapy as he has already been doing exercises in bed this am. Neurovascular intact and incision: dressing C/D/I, bilaterally.  LABS  Basename    HGB     11.8  HCT     34.5   POD #2  BP: 130/75 ; Pulse: 80 ; Temp: 98.2 F (36.8 C) ; Resp: 16 Patient reports pain as mild, pain controlled. No events throughout the night.  Feels some soreness in the knees, but overall doing well.   Ready to be discharged home.  Dorsiflexion/plantar flexion intact, incision: dressing C/D/I, no cellulitis present and compartment soft.   LABS  Basename    HGB     10.4  HCT     30.2    Discharge Exam: General appearance: alert, cooperative and no distress Extremities: Homans sign is negative, no sign of DVT, no edema, redness or tenderness in the calves or thighs and no ulcers, gangrene or trophic changes  Disposition: Home with follow up in 2 weeks   Follow-up Information    [name], MD. Schedule an appointment as soon as possible for a visit in 2 week(s).   Specialty:  Orthopedic Surgery Contact information: [address] [place] [address] [phone]        [facility] Follow up.   Specialty:  Home Health Services Why:  home health physical therapy Contact information: [place] [address] [phone]           Discharge Instructions    Call MD / Call 911    Complete by:  As directed    If you experience chest pain or shortness of breath, CALL 911 and be transported to the hospital emergency room.  If you develope a fever above 101 F, pus (white drainage) or increased drainage or redness at the wound, or calf pain,  call your surgeon's office.   Change dressing    Complete by:  As directed    Maintain surgical dressing until follow up in the clinic. If the edges start to pull up, may reinforce with tape. If the dressing is no longer working, may remove and cover with gauze and tape, but must keep the area dry and clean.  Call with any questions or concerns.   Constipation Prevention    Complete by:  As directed    Drink plenty of fluids.  Prune juice may be helpful.  You may use a stool softener, such as Colace (over the counter) 100 mg twice a day.  Use MiraLax (over the counter) for constipation as needed.   Diet - low sodium heart healthy    Complete by:  As directed    Discharge instructions    Complete by:  As directed    Maintain surgical dressing until follow up in the clinic. If the edges start to pull up, may reinforce with tape. If the dressing is no longer working, may remove and cover with gauze and tape, but must keep the area dry and clean.  Follow up in 2 weeks at [facility] Call with any questions or concerns.   Increase activity slowly as tolerated    Complete by:  As directed    Weight bearing as tolerated with assist device (walker, cane, etc) as directed, use it as long as suggested by your surgeon or therapist, typically at least 4-6 weeks.   TED hose    Complete by:  As directed    Use stockings (TED hose) for 2 weeks on both leg(s).  You may remove them at night for sleeping.      Allergies as of 08/04/2016   No Known Allergies     Medication List    STOP taking these medications   etodolac 400 MG tablet Commonly known as:  LODINE     TAKE these medications   allopurinol 300 MG tablet Commonly known as:  ZYLOPRIM TAKE ONE TABLET BY MOUTH ONE TIME DAILY ** INS ONLY ALLOW 30 DAYS SUPPLY** What changed:  how much to take  how to take this  when to take this  additional instructions   amLODipine 5 MG tablet Commonly known as:  NORVASC Take 1 tablet  (5 mg total) by mouth daily.   aspirin EC 81 MG tablet Take 1 tablet (81 mg total) by mouth 2 (two) times daily. Start the day after finishing Xarelto.  Take for 4 weeks, then resume regular dose. Start taking on:  08/21/2016 What changed:  when to take this  additional instructions   celecoxib 200 MG capsule Commonly known as:  CELEBREX Take 1 capsule (200 mg total) by mouth every 12 (twelve) hours. Take along with Pepcid to help avoid GI upset / ulcers.   docusate sodium 100 MG capsule Commonly known as:  COLACE Take 1 capsule (100 mg total) by mouth 2 (two) times daily.   ferrous sulfate 325 (65 FE) MG tablet Take 1 tablet (325 mg total) by mouth 3 (three) times daily after meals.   fluticasone 50 MCG/ACT nasal spray Commonly known as:  FLONASE USE 2 SPRAYS IN EACH NOSTRIL AT BEDTIME   hydrochlorothiazide 12.5 MG capsule Commonly known as:  MICROZIDE Take 1 capsule (12.5 mg total) by mouth daily.   HYDROcodone-acetaminophen 7.5-325 MG tablet Commonly known as:  NORCO Take 1-2 tablets by mouth every 4 (four) hours as needed for moderate pain.   lisinopril 40 MG tablet Commonly known as:  PRINIVIL,ZESTRIL Take 1 tablet (40 mg total) by mouth 2 (two) times daily. What changed:  when to take this  Another medication with the same name was removed. Continue taking this medication, and follow the directions you see here.   methocarbamol 500 MG tablet Commonly known as:  ROBAXIN Take 1 tablet (500 mg total) by mouth every 6 (six) hours as needed for muscle spasms.   omeprazole 40 MG capsule Commonly known as:  PRILOSEC TAKE ONE CAPSULE BY MOUTH TWICE A DAY BEFORE MEALS   polyethylene glycol packet Commonly known as:  MIRALAX / GLYCOLAX Take 17 g by mouth 2 (two) times daily.   rivaroxaban 10 MG Tabs tablet Commonly known as:  XARELTO Take 1 tablet (10 mg total) by mouth daily.        Signed: West Pugh. Thurston Brendlinger   PA-C  08/10/2016, 9:47 AM

## 2017-05-06 ENCOUNTER — Encounter: Payer: Self-pay | Admitting: Family Medicine

## 2017-05-30 ENCOUNTER — Other Ambulatory Visit: Payer: Self-pay | Admitting: Family Medicine

## 2017-05-30 DIAGNOSIS — E785 Hyperlipidemia, unspecified: Secondary | ICD-10-CM

## 2017-06-08 ENCOUNTER — Other Ambulatory Visit: Payer: Self-pay | Admitting: Family Medicine

## 2017-06-08 DIAGNOSIS — I10 Essential (primary) hypertension: Secondary | ICD-10-CM

## 2017-06-08 NOTE — Telephone Encounter (Signed)
Patient needs an OV for more refills.

## 2017-06-18 ENCOUNTER — Other Ambulatory Visit: Payer: Self-pay | Admitting: Family Medicine

## 2017-06-18 DIAGNOSIS — I10 Essential (primary) hypertension: Secondary | ICD-10-CM

## 2017-06-19 ENCOUNTER — Other Ambulatory Visit: Payer: Self-pay | Admitting: Family Medicine

## 2017-06-19 DIAGNOSIS — I10 Essential (primary) hypertension: Secondary | ICD-10-CM

## 2017-07-05 ENCOUNTER — Other Ambulatory Visit: Payer: Self-pay | Admitting: Family Medicine

## 2017-07-05 DIAGNOSIS — I10 Essential (primary) hypertension: Secondary | ICD-10-CM

## 2017-09-19 ENCOUNTER — Other Ambulatory Visit: Payer: Self-pay | Admitting: Family Medicine

## 2017-09-19 DIAGNOSIS — E785 Hyperlipidemia, unspecified: Secondary | ICD-10-CM

## 2017-09-19 NOTE — Telephone Encounter (Signed)
Pt needs an office visit for more refills 

## 2017-10-17 ENCOUNTER — Other Ambulatory Visit: Payer: Self-pay | Admitting: Family Medicine

## 2017-10-17 DIAGNOSIS — E785 Hyperlipidemia, unspecified: Secondary | ICD-10-CM

## 2017-10-17 NOTE — Telephone Encounter (Signed)
Pt needs an office visit for more refills 

## 2017-11-16 ENCOUNTER — Other Ambulatory Visit: Payer: Self-pay | Admitting: Family Medicine

## 2017-11-16 DIAGNOSIS — E785 Hyperlipidemia, unspecified: Secondary | ICD-10-CM

## 2017-11-16 NOTE — Telephone Encounter (Signed)
Patient needs an OV visit for m

## 2017-11-20 ENCOUNTER — Other Ambulatory Visit: Payer: Self-pay | Admitting: Family Medicine

## 2017-11-20 DIAGNOSIS — E785 Hyperlipidemia, unspecified: Secondary | ICD-10-CM

## 2017-12-15 ENCOUNTER — Other Ambulatory Visit: Payer: Self-pay | Admitting: Family Medicine

## 2017-12-15 DIAGNOSIS — E785 Hyperlipidemia, unspecified: Secondary | ICD-10-CM

## 2017-12-27 ENCOUNTER — Telehealth: Payer: Self-pay | Admitting: Family Medicine

## 2017-12-27 ENCOUNTER — Telehealth: Payer: Self-pay | Admitting: *Deleted

## 2017-12-27 NOTE — Telephone Encounter (Signed)
Prior auth for Omeprazole 40mg  capsules sent to [email or web address].

## 2017-12-27 NOTE — Telephone Encounter (Signed)
Prior Auth has been received for Omeprazole 40 mg from pt pharmacy , Rx is awaiting for approval spoke with pt and explained the process, pt voiced understanding

## 2017-12-27 NOTE — Telephone Encounter (Signed)
Copied from [place] [id number]. Topic: General - Other >> Dec 27, 2017 12:52 PM Yvette Rack wrote: Reason for CRM: Pt called in stating he was advised by his pharmacy that 2 requests were sent to Dr. Sherren Mocha for approval of his medication. Pt  requests that the approval for omeprazole (PRILOSEC) 40 MG capsule is sent to his insurance company. Pt would like this done as soon as possible.

## 2018-01-06 NOTE — Telephone Encounter (Signed)
Fax received from [employer] stating the request was approved from 12/27/2017-12/27/2018.  I called [facility] and informed Juliann Pulse of this.

## 2018-01-06 NOTE — Telephone Encounter (Signed)
Pt Rx was approved and sent to pt pharmacy for refill.

## 2018-04-14 ENCOUNTER — Other Ambulatory Visit: Payer: Self-pay | Admitting: Family Medicine

## 2018-04-14 DIAGNOSIS — I10 Essential (primary) hypertension: Secondary | ICD-10-CM

## 2018-04-25 ENCOUNTER — Other Ambulatory Visit: Payer: Self-pay | Admitting: Family Medicine

## 2018-04-25 DIAGNOSIS — E785 Hyperlipidemia, unspecified: Secondary | ICD-10-CM

## 2018-05-15 ENCOUNTER — Other Ambulatory Visit: Payer: Self-pay | Admitting: Family Medicine

## 2018-05-15 DIAGNOSIS — I10 Essential (primary) hypertension: Secondary | ICD-10-CM

## 2018-05-16 ENCOUNTER — Other Ambulatory Visit: Payer: Self-pay | Admitting: Family Medicine

## 2018-05-16 DIAGNOSIS — I10 Essential (primary) hypertension: Secondary | ICD-10-CM

## 2018-05-16 MED ORDER — AMLODIPINE BESYLATE 5 MG PO TABS: 5.0000 mg | ORAL_TABLET | Freq: Every day | ORAL | 4 refills | Status: AC

## 2018-05-24 ENCOUNTER — Other Ambulatory Visit: Payer: Self-pay | Admitting: Family Medicine

## 2018-05-24 DIAGNOSIS — I10 Essential (primary) hypertension: Secondary | ICD-10-CM

## 2018-05-25 ENCOUNTER — Other Ambulatory Visit: Payer: Self-pay | Admitting: Family Medicine

## 2018-05-25 DIAGNOSIS — E785 Hyperlipidemia, unspecified: Secondary | ICD-10-CM

## 2018-06-16 ENCOUNTER — Other Ambulatory Visit: Payer: Self-pay | Admitting: Family Medicine

## 2018-06-16 DIAGNOSIS — I10 Essential (primary) hypertension: Secondary | ICD-10-CM

## 2018-06-20 ENCOUNTER — Other Ambulatory Visit: Payer: Self-pay | Admitting: Family Medicine

## 2018-06-20 DIAGNOSIS — I10 Essential (primary) hypertension: Secondary | ICD-10-CM

## 2019-05-30 ENCOUNTER — Encounter: Payer: Self-pay | Admitting: Gastroenterology

## 2021-11-27 ENCOUNTER — Encounter: Payer: Self-pay | Admitting: Gastroenterology
